# Patient Record
Sex: Female | Born: 1965 | Race: White | Hispanic: No | Marital: Married | Smoking: Never smoker
Health system: Southern US, Community
[De-identification: ages and names within clinical notes are randomized; demographics above are authoritative.]

## PROBLEM LIST (undated history)

## (undated) DIAGNOSIS — C569 Malignant neoplasm of unspecified ovary: Secondary | ICD-10-CM

## (undated) HISTORY — PX: HIP FRACTURE SURGERY: SHX118

---

## 1998-01-24 ENCOUNTER — Emergency Department (HOSPITAL_COMMUNITY): Admission: EM | Admit: 1998-01-24 | Discharge: 1998-01-24 | Payer: Self-pay | Admitting: Emergency Medicine

## 1999-07-22 ENCOUNTER — Encounter: Payer: Self-pay | Admitting: Emergency Medicine

## 1999-07-22 ENCOUNTER — Emergency Department (HOSPITAL_COMMUNITY): Admission: EM | Admit: 1999-07-22 | Discharge: 1999-07-22 | Payer: Self-pay | Admitting: Emergency Medicine

## 1999-09-16 ENCOUNTER — Inpatient Hospital Stay (HOSPITAL_COMMUNITY): Admission: EM | Admit: 1999-09-16 | Discharge: 1999-09-17 | Payer: Self-pay | Admitting: Emergency Medicine

## 1999-09-16 ENCOUNTER — Encounter: Payer: Self-pay | Admitting: *Deleted

## 1999-11-23 ENCOUNTER — Encounter: Payer: Self-pay | Admitting: Emergency Medicine

## 1999-11-23 ENCOUNTER — Inpatient Hospital Stay (HOSPITAL_COMMUNITY): Admission: EM | Admit: 1999-11-23 | Discharge: 1999-11-25 | Payer: Self-pay | Admitting: Emergency Medicine

## 1999-11-29 ENCOUNTER — Encounter: Admission: RE | Admit: 1999-11-29 | Discharge: 1999-11-29 | Payer: Self-pay | Admitting: Urology

## 1999-11-29 ENCOUNTER — Encounter: Payer: Self-pay | Admitting: Urology

## 2000-01-23 ENCOUNTER — Emergency Department (HOSPITAL_COMMUNITY): Admission: EM | Admit: 2000-01-23 | Discharge: 2000-01-23 | Payer: Self-pay | Admitting: Family Medicine

## 2000-01-23 ENCOUNTER — Encounter: Payer: Self-pay | Admitting: *Deleted

## 2000-04-15 ENCOUNTER — Emergency Department (HOSPITAL_COMMUNITY): Admission: EM | Admit: 2000-04-15 | Discharge: 2000-04-15 | Payer: Self-pay | Admitting: Emergency Medicine

## 2000-04-15 ENCOUNTER — Encounter: Payer: Self-pay | Admitting: Emergency Medicine

## 2002-11-23 ENCOUNTER — Encounter: Payer: Self-pay | Admitting: Emergency Medicine

## 2002-11-23 ENCOUNTER — Emergency Department (HOSPITAL_COMMUNITY): Admission: EM | Admit: 2002-11-23 | Discharge: 2002-11-23 | Payer: Self-pay | Admitting: Emergency Medicine

## 2003-02-02 ENCOUNTER — Ambulatory Visit (HOSPITAL_COMMUNITY): Admission: RE | Admit: 2003-02-02 | Discharge: 2003-02-02 | Payer: Self-pay | Admitting: Obstetrics & Gynecology

## 2003-02-02 ENCOUNTER — Encounter (INDEPENDENT_AMBULATORY_CARE_PROVIDER_SITE_OTHER): Payer: Self-pay | Admitting: Specialist

## 2003-09-04 ENCOUNTER — Inpatient Hospital Stay (HOSPITAL_COMMUNITY): Admission: AD | Admit: 2003-09-04 | Discharge: 2003-09-05 | Payer: Self-pay | Admitting: Obstetrics & Gynecology

## 2003-12-07 ENCOUNTER — Inpatient Hospital Stay (HOSPITAL_COMMUNITY): Admission: AD | Admit: 2003-12-07 | Discharge: 2003-12-07 | Payer: Self-pay | Admitting: Obstetrics

## 2004-02-11 ENCOUNTER — Inpatient Hospital Stay (HOSPITAL_COMMUNITY): Admission: AD | Admit: 2004-02-11 | Discharge: 2004-02-11 | Payer: Self-pay | Admitting: Obstetrics

## 2005-09-06 ENCOUNTER — Encounter: Payer: Self-pay | Admitting: Emergency Medicine

## 2009-12-29 ENCOUNTER — Emergency Department (HOSPITAL_COMMUNITY): Admission: EM | Admit: 2009-12-29 | Discharge: 2009-12-29 | Payer: Self-pay | Admitting: Emergency Medicine

## 2010-04-10 ENCOUNTER — Emergency Department (HOSPITAL_COMMUNITY)
Admission: EM | Admit: 2010-04-10 | Discharge: 2010-04-10 | Payer: Self-pay | Source: Home / Self Care | Admitting: Emergency Medicine

## 2010-05-29 ENCOUNTER — Encounter: Payer: Self-pay | Admitting: Obstetrics & Gynecology

## 2010-06-14 ENCOUNTER — Emergency Department (HOSPITAL_COMMUNITY)
Admission: EM | Admit: 2010-06-14 | Discharge: 2010-06-14 | Disposition: A | Discharge status: Home / Self Care | Payer: Self-pay | Attending: Emergency Medicine | Admitting: Emergency Medicine

## 2010-06-14 DIAGNOSIS — R112 Nausea with vomiting, unspecified: Secondary | ICD-10-CM | POA: Insufficient documentation

## 2010-06-14 DIAGNOSIS — R197 Diarrhea, unspecified: Secondary | ICD-10-CM | POA: Insufficient documentation

## 2010-06-14 LAB — URINALYSIS, ROUTINE W REFLEX MICROSCOPIC
Bilirubin Urine: NEGATIVE
Hgb urine dipstick: NEGATIVE
Ketones, ur: NEGATIVE mg/dL
Nitrite: NEGATIVE
Protein, ur: NEGATIVE mg/dL
Specific Gravity, Urine: 1.008 (ref 1.005–1.030)
Urine Glucose, Fasting: NEGATIVE mg/dL
Urobilinogen, UA: 0.2 mg/dL (ref 0.0–1.0)
pH: 6 (ref 5.0–8.0)

## 2010-06-14 LAB — POCT PREGNANCY, URINE: Preg Test, Ur: NEGATIVE

## 2010-06-14 LAB — POCT I-STAT, CHEM 8
BUN: 13 mg/dL (ref 6–23)
Calcium, Ion: 1.16 mmol/L (ref 1.12–1.32)
Chloride: 108 mEq/L (ref 96–112)
Creatinine, Ser: 0.8 mg/dL (ref 0.4–1.2)
Glucose, Bld: 101 mg/dL — ABNORMAL HIGH (ref 70–99)
HCT: 34 % — ABNORMAL LOW (ref 36.0–46.0)
Hemoglobin: 11.6 g/dL — ABNORMAL LOW (ref 12.0–15.0)
Potassium: 4.2 mEq/L (ref 3.5–5.1)
Sodium: 141 mEq/L (ref 135–145)
TCO2: 21 mmol/L (ref 0–100)

## 2010-09-23 NOTE — Procedures (Signed)
[place]  Patient:    Brenda Gallagher, Brenda Gallagher                       MRN: [id number] Adm. Date:  [id number] Attending:  Colon Branch CC:         Juluis Mire, M.D.                           Procedure Report  PROCEDURE:  Coronary arteriography.  INDICATIONS:  Recurrent chest pain.  The left main coronary artery was normal.  The left anterior descending artery was normal.  The first and second diagonal branches were normal.  The circumflex coronary artery was nondominant and was normal.  The right coronary artery was somewhat difficult to engage selectively.  JL4 and JL3.5 catheters were used.  There was no significant disease in this vessel and it was dominant.  RAO VENTRICULOGRAPHY:  The RAO ventriculography revealed hyperdynamic LV function with an ejection fraction in excess of 80%.  There was no significant mitral valve prolapse or MR.  The aortic pressure was 122/70.  The LV pressure was 114/25.  The right femoral artery was closed at the end of the case with the Perclose device with good hemostasis.  IMPRESSION:  The patient is having noncardiac chest pain.  She has had multiple evaluations now for atypical chest pain.  She has had a normal Cardiolite and normal coronary arteriography.  She has also had previous pulmonary emboli ruled out.  She will be discharged later today to follow up with Juluis Mire, M.D. DD:  11/24/99 TD:  11/24/99 Job: [id number]

## 2010-09-23 NOTE — Consult Note (Signed)
NAME:  Brenda Gallagher, Brenda Gallagher                          ACCOUNT NO.:  [id number]   MEDICAL RECORD NO.:  [id number]                   PATIENT TYPE:  MAT   LOCATION:  MATC                                 FACILITY:  [facility]   PHYSICIAN:  Michael L. Thad Ranger, M.D.           DATE OF BIRTH:  [birth year]   DATE OF CONSULTATION:  09/04/2003  DATE OF DISCHARGE:                                   CONSULTATION   REASON FOR EVALUATION:  Headache and dizziness.   HISTORY OF PRESENT ILLNESS:  This is the initial emergency room consultation  evaluation of this 45 year old woman with little past medical history,  presently approximately [redacted] weeks pregnant, who presents to the maternity  admissions tonight with a new headache.  The patient reports that she woke  up this morning with a dull headache in the frontal area, and on the right  side of the head.  It was relatively mild, but built up in severity to the  point that by the mid-afternoon it was severe and took on a throbbing  character.  It was associated with a dizzy sensation, more of a  lightheadedness than a true vertigo, as well as a sense of nausea, though  there was no vomiting.  She also felt a little  bit out of it, as if she  were there, but not there.  This headache lasted a couple of hours, and  was exacerbated by any movement, although this was not associated with large  amounts of photophobia or phonophobia.  She denies ever experiencing a  headache like this before.  Ultimately, she became concerned, and presented  to the maternity admissions unit, and a subsequent neurologic consultation  was requested.  The patient did take a few Tylenol this afternoon, and does  feel somewhat better.  She says that her headache is mild, as long as she  remains still, but if she gets up and around, the headache becomes  noticeably more severe.  She otherwise feels washed out and tired, but has  no other specific complaints at this time.   PAST MEDICAL HISTORY:   She denies any other chronic medical problems.  She  has had 3 uneventful pregnancies.  She denies any history of car sickness as  a child.   FAMILY HISTORY:  No other known headache syndromes running in the family.   MEDICATIONS:  Prenatal vitamins.   PHYSICAL EXAMINATION:  VITAL SIGNS:  Temperature 97.8, blood pressure  137/74, pulse 83, respirations 20.  GENERAL:  This is a healthy-appearing, not obviously gravid woman, supine in  a hospital bed, in no evident distress.  HEENT:  Cranium was normocephalic and atraumatic.  Oropharynx was benign.  NECK:  Supple without carotid bruits.  HEART:  Regular rate and rhythm without murmur.  NEUROLOGIC:  Mental status - she is awake, alert, and fully oriented.  Recent and remote memory are intact.  Attention span and concentration and  fund of knowledge are appropriate.  Speech is fluent and not dysarthric.  There are no defects to confrontational naming.  Mood is euthymic, and  affect appropriate.  Cranial nerves - funduscopic exam is benign;  specifically, disks are flat without evidence of palpable edema.  Pupils are  equal and briskly reactive.  Extraocular movements are full without  nystagmus.  Visual fields full to confrontation.  Hearing is intact to  conversational speech.  Facial sensation is intact to pinprick.  Face,  tongue, and palate move normally and symmetrically.  Shoulder shrug strength  is normal.  Motor testing with normal bulk and tone.  Normal strength in all  tested extremity muscles.  Sensation intact to light tough and pinprick in  all extremities. Coordination rapid movements are performed well.  Finger-to-  nose and heel-to-shin are performed adequately.  Gait - she arises from the  bed with a little difficulty.  Stance is normal.  She is able to tandem walk  without difficulty.  Reflexes 2+ and symmetric.  Toes are downgoing.   LABORATORY REVIEW:  CBC revealed a white count mildly elevated at 12.4,  hemoglobin  12.8, platelets 394,000.  CMET is unremarkable, except for a  mildly elevated glucose of 113.  Quantitative beta hCG is appropriate for  pregnancy stage.  Urinalysis is remarkable for mild ketones.  CT of the head  is pending at this time.   IMPRESSION:  New onset headache in an early gravid 45 year old woman with a  normal neurological evaluation.  This appears to be a benign headache  syndrome with vascular qualities associated with some nonspecific dizziness,  probably representing a migraine event.  There are no malignant features by  either history of examination.   RECOMMENDATIONS:  I would check CT, but anticipate that it will be normal.  If so, would treat symptomatically with pain medications and medication for  sleep promotion.  I think she will feel better tomorrow after a good night's  sleep.  She can then follow up as needed.  If the headache recurs,  consideration may need to be given to prophylaxis.                                               Michael L. Thad Ranger, M.D.    MLR/MEDQ  D:  09/04/2003  T:  09/04/2003  Job:  [id number]

## 2010-09-23 NOTE — Discharge Summary (Signed)
[place]. [facility]  Patient:    Brenda Gallagher, Brenda Gallagher                       MRN: [id number] Adm. Date:  [id number] Disc. Date: [id number] Attending:  Colon Branch Dictator:   Tereso Newcomer, P.A.-C CC:         Madolyn Frieze. Jens Som, M.D. [facility]             Peter C. Eden Emms, M.D. [facility]             Juluis Mire, M.D.                           Discharge Summary  DISCHARGE DIAGNOSES: 1. Mitral valve prolapse. 2. Syncope. 3. Dyspnea on exertion with non-exertional chest pain.  HISTORY OF PRESENT ILLNESS:  Ms. Brenda Gallagher is a 45 year old white female with a history of coronary artery disease who presented to the emergency room with complaints of chest pressure.  The pain began while walking about her house around 8 a.m. on the morning of admission.  She became clammy, short of breath and diaphoretic passing out.  This was not witnessed.  She claimed to be out for about two minutes before recovering.  The chest discomfort persisted in the emergency department with partial relief after sublingual nitroglycerin. Chest pain was reportedly worse after lying down and better after standing up. She also associated this with some nausea and vomiting x 1 on the date of admission.  There was no referred radiating or sharp pain.  The pain was somewhat aggravated by lifting and deep breathing.  She reported some hemoptysis and sharp pain in her chest two weeks prior.  She has experienced recurrent chest pressure with hours per episode since March, sometimes lasting two to three days.  This is not usually precipitated by exertion.  She has had dyspnea on exertion since onset of first chest pain.  She reports and occasional rapid heart beat.  PHYSICAL EXAMINATION:  GENERAL:  This is an alert female in no acute distress.  VITAL SIGNS:  Blood pressure 110/68, standing blood pressure 125/72, supine pulse 81, standing pulse 81.  HEART:  Apical impulse not displaced, regular rate and  rhythm.  LUNGS:  Clear.  EXTREMITIES:  Trace edema.  LABORATORY DATA AND X-RAY FINDINGS:  Hemoglobin 12.5, hematocrit 36.7, wbcs 5.6, platelet count 301,000.  Sodium 140, potassium 3.5, chloride 109, CO2 27, BUN 16, creatinine 0.7, glucose 106.  HOSPITAL COURSE:  The patient was admitted.  Given her symptoms not being consistent with ischemic pain and a recent negative Cardiolite, it was felt that cardiac ischemia was unlikely.  The patient was ruled out for MI by enzymes and EKG.  Her total CK was 54, 41 and 37 with MB 0.8, 0.3, 0.7, troponin I less than 0.03 x 3.  The patient had a normal TSH of 0.493.  She had a 2D echocardiogram performed on the date of admission.  This showed a normal left ventricle in size, wall thickness and ejection fraction.  There was mild mitral valve prolapse noted.  VQ scan was performed.  The patient had a negative chest CT for a pulmonary embolism in 3/01.  It was questioned if this was a false negative.  The VQ scan was normal.  This was a preliminary reading at the time of discharge.  The patient was monitored overnight on telemetry without any significant arrhythmias noted.  It was felt that she was stable enough for discharge to home on the morning of 09/17/99.  DISCHARGE MEDICATIONS: 1. Toprol XL 50 mg 1/2 p.o. q.d. 2. Tylenol p.r.n. 3. Vitamins as usual.  ACTIVITY:  As tolerated.  DIET:  Low fat, low cholesterol diet.  FOLLOWUP:  The patient is to call our office on Monday to set up a time to pick of a 24-hour ventilation monitor.  She will follow up with Dr. Eden Emms in the office after the ventilation monitor is complete.  She has been provided with the phone number.  This will help workup her report syncope at the time of admission. DD:  09/17/99 TD:  09/19/99 Job: [id number]

## 2010-09-23 NOTE — Discharge Summary (Signed)
[place]. [facility]  Patient:    Brenda Gallagher, Brenda Gallagher                       MRN: [id number] Adm. Date:  [id number] Disc. Date: 11/25/99 Attending:  Colon Branch Dictator:   Joellyn Rued, P.A.C. CC:         Dr. Tiburcio Pea                           Discharge Summary  DATE OF BIRTH: [birth year]  HISTORY OF PRESENT ILLNESS: Brenda Gallagher is a 45 year old female without a cardiac history; in fact, she had a negative Cardiolite back in March 2001. She describes onset of left-sided chest discomfort the morning of admission while doing her activities of daily living and it then eased off and she went to work.  She then developed a knife-like discomfort associated with shortness of breath.  Tums or Mylanta did not help the discomfort.  She also felt nauseated and diaphoretic.  At one point she felt like she was going to pass out.  Dr. Tiburcio Pea referred her for further evaluation.  LABORATORY DATA: CKs and troponins were negative for myocardial infarction. Hemoglobin was 13.4, hematocrit 38.1; normal indices; platelets 429,000; WBC 8.9.  PT 14.5, PTT 27.  Sodium was 137, potassium 2.8, BUN 12, creatinine 0.8, glucose 102.  Repeat potassium on November 24, 1999 was 3.6.  Fasting lipid panel showed a total cholesterol of 162, triglyceride 113, HDL 33, LDL 106.  TSH was borderline low at 0.252.  This was repeated and was 0.240.  Chest x-ray showed poor basilar aeration, no active disease.  EKG showed normal sinus rhythm, no acute changes.  HOSPITAL COURSE: Given Brenda Gallagher continued recurrent symptoms and negative prior cardiac work-up cardiac catheterization was performed on November 24, 1999 by Dr. Eden Emms and this revealed normal coronaries with normal LV function, with EF of approximately 80%.  There was no significant MR or MVP.  Her pressures were within normal limits.  Post-sheath removal and bed rest it was felt she could be discharged home; however, Brenda Gallagher  continued to have chest discomfort and stated that she wanted to spend the night until she felt better.  The following morning she was ambulating.  She continued to have the chest discomfort but Dr. Eden Emms felt it was not cardiac in etiology and she was thus discharged home.  FINAL DIAGNOSES:  1. Noncardiac chest discomfort.  2. Low TSH.  DISPOSITION: She is discharged home.  FOLLOW-UP: She is asked to follow up with Dr. Tiburcio Pea and to consider having a repeat TSH along with a T4 performed.  DISCHARGE MEDICATIONS:  1. Toprol XL 25 mg q.d.  2. Tylenol.  3. Multivitamin.  DISCHARGE ACTIVITY: She was advised to do no lifting, driving, sexual activity, or heavy exertion for two days.  DISCHARGE INSTRUCTIONS: If she has any problems with her catheterization site she was asked to call us immediately. DD:  11/25/99 TD:  11/25/99 Job: [id number]

## 2010-09-23 NOTE — Op Note (Signed)
   NAME:  SHAREECE, BULTMAN                          ACCOUNT NO.:  [id number]   MEDICAL RECORD NO.:  [id number]                   PATIENT TYPE:  AMB   LOCATION:  SDC                                  FACILITY:  [facility]   PHYSICIAN:  Roseanna Rainbow, M.D.         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   PROCEDURE:  Removal of Norplant and lysis of adhesions.   SURGEON:  Roseanna Rainbow, M.D.   ANESTHESIA:  Laryngeal mask airway.   COMPLICATIONS:  None.   ESTIMATED BLOOD LOSS:  Less than 50 cc.   PROCEDURE:  The patient was taken to the operating room.  A laryngeal mask  airway was placed without difficulty.  The arm was prepped and draped in  usual sterile fashion.  The skin was incised at the initial insertion point.  A plane was dissected between the dermis and subcutaneous layer.  Each  implant was then grasped with a mosquito clamp and the scar tissue sharply  removed.  Five of the capsules were removed intact, one  was fractured;  however, it was removed totally into segments.  The subcutaneous layer was  then irrigated.  The skin was then reapproximated with intracuticular  interrupted sutures of 3-0 Vicryl.  Adequate hemostasis was noted.  The  patient tolerated the procedure well.  At the close of the procedure the  sponge, needle and instrument count were reported to be correct x2.  The  patient was taken to the PACU in stable condition.                                               Roseanna Rainbow, M.D.    Judee Clara  D:  02/04/2003  T:  02/04/2003  Job:  [id number]

## 2010-11-05 ENCOUNTER — Emergency Department (HOSPITAL_COMMUNITY)
Admission: EM | Admit: 2010-11-05 | Discharge: 2010-11-05 | Disposition: A | Discharge status: Home / Self Care | Payer: Self-pay | Attending: Emergency Medicine | Admitting: Emergency Medicine

## 2010-11-05 ENCOUNTER — Emergency Department (HOSPITAL_COMMUNITY): Payer: Self-pay

## 2010-11-05 DIAGNOSIS — R6889 Other general symptoms and signs: Secondary | ICD-10-CM | POA: Insufficient documentation

## 2010-11-05 DIAGNOSIS — J40 Bronchitis, not specified as acute or chronic: Secondary | ICD-10-CM | POA: Insufficient documentation

## 2010-11-05 DIAGNOSIS — R05 Cough: Secondary | ICD-10-CM | POA: Insufficient documentation

## 2010-11-05 DIAGNOSIS — R059 Cough, unspecified: Secondary | ICD-10-CM | POA: Insufficient documentation

## 2011-03-16 ENCOUNTER — Encounter: Payer: Self-pay | Admitting: *Deleted

## 2011-03-16 ENCOUNTER — Emergency Department (HOSPITAL_COMMUNITY)
Admission: EM | Admit: 2011-03-16 | Discharge: 2011-03-16 | Disposition: A | Discharge status: Home / Self Care | Payer: Self-pay | Attending: Emergency Medicine | Admitting: Emergency Medicine

## 2011-03-16 DIAGNOSIS — R0981 Nasal congestion: Secondary | ICD-10-CM

## 2011-03-16 DIAGNOSIS — J3489 Other specified disorders of nose and nasal sinuses: Secondary | ICD-10-CM | POA: Insufficient documentation

## 2011-03-16 DIAGNOSIS — H9209 Otalgia, unspecified ear: Secondary | ICD-10-CM | POA: Insufficient documentation

## 2011-03-16 MED ORDER — DIPHENHYDRAMINE HCL 25 MG PO CAPS
25.0000 mg | ORAL_CAPSULE | Freq: Once | ORAL | Status: AC
  Administered 2011-03-16: 25 mg via ORAL
  Filled 2011-03-16: qty 1

## 2011-03-16 MED ORDER — PSEUDOEPHEDRINE-IBUPROFEN 30-200 MG PO TABS: ORAL_TABLET | ORAL | Status: DC

## 2011-03-16 NOTE — ED Provider Notes (Signed)
History     CSN: [id number] Arrival date & time: 03/16/2011  1:08 PM   First MD Initiated Contact with Patient 03/16/11 1506      Chief Complaint  Patient presents with  . Otalgia    (Consider location/radiation/quality/duration/timing/severity/associated sxs/prior treatment) Patient is a 45 y.o. female presenting with ear pain.  Otalgia  Patient presents to ER complaining of gradual onset sinus pressure and left ear pain that began 2 days ago with gradual onset right ear ache today with associated "ear ringing" and mild HA but denies known fevers, chills, dizziness, visual changes, stiff neck, rash, cough, CP or SOB. Patient has taken tylenol with mild improvement of symptoms. Denies alleviating factors.   History reviewed. No pertinent past medical history.  History reviewed. No pertinent past surgical history.  No family history on file.  History  Substance Use Topics  . Smoking status: Never Smoker   . Smokeless tobacco: Not on file  . Alcohol Use: No    OB History    Grav Para Term Preterm Abortions TAB SAB Ect Mult Living                  Review of Systems  HENT: Positive for ear pain, sinus pressure and tinnitus.   All other systems reviewed and are negative.    Allergies  Sulfa antibiotics  Home Medications   Current Outpatient Rx  Name Route Sig Dispense Refill  . PSEUDOEPHEDRINE-IBUPROFEN 30-200 MG PO TABS  Take as directed by over the counter instructions for sinus congestion, inflammation and pain 84 each 0    BP 131/84  Pulse 75  Temp(Src) 98.3 F (36.8 C) (Oral)  Resp 14  Wt 140 lb (63.504 kg)  SpO2 100%  Physical Exam  Vitals reviewed. Constitutional: She is oriented to person, place, and time. She appears well-developed and well-nourished. No distress.  HENT:  Head: Normocephalic and atraumatic.  Right Ear: External ear normal.  Left Ear: External ear normal.  Nose: Nose normal.  Mouth/Throat: No oropharyngeal exudate.       Mild  erythema of posterior pharynx and tonsils no tonsillar exudate or enlargement. Patent airway. Swallowing secretions well  Eyes: Conjunctivae are normal.  Neck: Normal range of motion. Neck supple.  Cardiovascular: Normal rate, regular rhythm and normal heart sounds.  Exam reveals no gallop and no friction rub.   No murmur heard. Pulmonary/Chest: Effort normal and breath sounds normal. No respiratory distress. She has no wheezes. She has no rales. She exhibits no tenderness.  Abdominal: Soft. She exhibits no distension and no mass. There is no tenderness. There is no rebound and no guarding.  Lymphadenopathy:    She has no cervical adenopathy.  Neurological: She is alert and oriented to person, place, and time. She has normal reflexes.       Gross hearing to whisper in both ears intact.   Skin: Skin is warm and dry. No rash noted. She is not diaphoretic.  Psychiatric: She has a normal mood and affect.    ED Course  Procedures (including critical care time)  Labs Reviewed - No data to display No results found.   1. Nasal congestion   2. Earache       MDM  Afebrile, non toxic appearing with normal TM's and hearing bilaterally.         Jenness Corner, [place] 03/16/11 [phone]

## 2011-03-16 NOTE — ED Notes (Signed)
Pt states "left ear has been bothering me since Monday, but they both hurt, they are ringing, today they are really bad"

## 2011-03-17 NOTE — ED Provider Notes (Signed)
Evaluation and management procedures were performed by the PA/NP under my supervision/collaboration.    Modestine Scherzinger D Lovetta Condie, MD 03/17/11 1432 

## 2011-08-23 ENCOUNTER — Emergency Department (HOSPITAL_COMMUNITY)
Admission: EM | Admit: 2011-08-23 | Discharge: 2011-08-23 | Disposition: A | Discharge status: Home / Self Care | Payer: Self-pay | Attending: Emergency Medicine | Admitting: Emergency Medicine

## 2011-08-23 ENCOUNTER — Emergency Department (HOSPITAL_COMMUNITY): Payer: Self-pay

## 2011-08-23 ENCOUNTER — Encounter (HOSPITAL_COMMUNITY): Payer: Self-pay

## 2011-08-23 DIAGNOSIS — R51 Headache: Secondary | ICD-10-CM | POA: Insufficient documentation

## 2011-08-23 DIAGNOSIS — R55 Syncope and collapse: Secondary | ICD-10-CM | POA: Insufficient documentation

## 2011-08-23 HISTORY — DX: Malignant neoplasm of unspecified ovary: C56.9

## 2011-08-23 LAB — CBC
HCT: 36.4 % (ref 36.0–46.0)
MCHC: 30.5 g/dL (ref 30.0–36.0)
MCV: 73.7 fL — ABNORMAL LOW (ref 78.0–100.0)
RBC: 4.94 MIL/uL (ref 3.87–5.11)

## 2011-08-23 LAB — BASIC METABOLIC PANEL
CO2: 26 mEq/L (ref 19–32)
Chloride: 106 mEq/L (ref 96–112)
GFR calc non Af Amer: >90 mL/min (ref >90)

## 2011-08-23 LAB — TROPONIN I: Troponin I: <0.3 ng/mL (ref <0.30)

## 2011-08-23 MED ORDER — SUMATRIPTAN SUCCINATE 25 MG PO TABS: 25.0000 mg | ORAL_TABLET | ORAL | Status: DC | PRN

## 2011-08-23 MED ORDER — ONDANSETRON 4 MG PO TBDP: 4.0000 mg | ORAL_TABLET | Freq: Three times a day (TID) | ORAL | Status: AC | PRN

## 2011-08-23 MED ORDER — DEXAMETHASONE SODIUM PHOSPHATE 10 MG/ML IJ SOLN: 10.0000 mg | Freq: Once | INTRAMUSCULAR | Status: DC

## 2011-08-23 MED ORDER — DEXAMETHASONE SODIUM PHOSPHATE 10 MG/ML IJ SOLN
10.0000 mg | Freq: Once | INTRAMUSCULAR | Status: AC
  Administered 2011-08-23: 10 mg via INTRAMUSCULAR
  Filled 2011-08-23: qty 1

## 2011-08-23 MED ORDER — OXYCODONE-ACETAMINOPHEN 5-325 MG PO TABS
2.0000 | ORAL_TABLET | Freq: Once | ORAL | Status: AC
  Administered 2011-08-23: 2 via ORAL
  Filled 2011-08-23: qty 2

## 2011-08-23 NOTE — ED Notes (Signed)
Patient reports that she passed out at work. Patient denies hitting her head. Patient denies feeling weak or dizzy.

## 2011-08-23 NOTE — ED Notes (Signed)
Pt. Discharged to home, pt. Alert and oriented, NAD noted, pt. Ambulatory gait steady  

## 2011-08-23 NOTE — ED Provider Notes (Signed)
History     CSN: [id number]  Arrival date & time 08/23/11  1420   First MD Initiated Contact with Patient 08/23/11 1757      Chief Complaint  Patient presents with  . Loss of Consciousness    (Consider location/radiation/quality/duration/timing/severity/associated sxs/prior treatment) HPI  Pt presents to the ED with complaints of near syncopal episode at work. She states that  She ate at 7:30am and then felt as thought she was going to pass out around 11;30am. The patient states that she has had a headache for 3 days and today she has had decreased hearing in the left ear. She denies having headache, generalized or focal weakness, urinary symptoms, chest pains, change in vision. Pt denies history of the same. Pt is in no acute distress and is resting comfortably. She has no complaints at this time but her work sent her to the ED from work to be evaluated.   Past Medical History  Diagnosis Date  . Ovarian cancer     Past Surgical History  Procedure Date  . Hip fracture surgery     Family History  Problem Relation Age of Onset  . Diabetes Mother   . Heart failure Mother   . Heart failure Father     History  Substance Use Topics  . Smoking status: Never Smoker   . Smokeless tobacco: Never Used  . Alcohol Use: No    OB History    Grav Para Term Preterm Abortions TAB SAB Ect Mult Living                  Review of Systems  All other systems reviewed and are negative.    Allergies  Sulfa antibiotics  Home Medications   Current Outpatient Rx  Name Route Sig Dispense Refill  . FERROUS SULFATE 325 (65 FE) MG PO TABS Oral Take 325 mg by mouth daily with breakfast.    . IBUPROFEN 200 MG PO TABS Oral Take 400 mg by mouth every 8 (eight) hours as needed. For pain.    Marland Kitchen ONDANSETRON 4 MG PO TBDP Oral Take 1 tablet (4 mg total) by mouth every 8 (eight) hours as needed for nausea. 20 tablet 0  . SUMATRIPTAN SUCCINATE 25 MG PO TABS Oral Take 1 tablet (25 mg total) by  mouth every 2 (two) hours as needed for migraine. 10 tablet 0    BP 132/77  Pulse 81  Temp(Src) 98.8 F (37.1 C) (Oral)  Resp 18  SpO2 100%  LMP 08/20/2011  Physical Exam  Nursing note and vitals reviewed. Constitutional: She is oriented to person, place, and time. She appears well-developed and well-nourished. No distress.  HENT:  Head: Normocephalic and atraumatic.  Eyes: Pupils are equal, round, and reactive to light.  Neck: Normal range of motion. Neck supple.  Cardiovascular: Normal rate and regular rhythm.   Pulmonary/Chest: Effort normal. No respiratory distress. She has no wheezes.  Abdominal: Soft.  Neurological: She is oriented to person, place, and time. She has normal strength. No cranial nerve deficit or sensory deficit. She displays a negative Romberg sign. GCS eye subscore is 4. GCS verbal subscore is 5. GCS motor subscore is 6.  Skin: Skin is warm and dry.    ED Course  Procedures (including critical care time)  Labs Reviewed  CBC - Abnormal; Notable for the following:    Hemoglobin 11.1 (*)    MCV 73.7 (*)    MCH 22.5 (*)    RDW 17.5 (*)  All other components within normal limits  BASIC METABOLIC PANEL  TROPONIN I   Ct Head Wo Contrast  08/23/2011  *RADIOLOGY REPORT*  Clinical Data: Syncope.  CT HEAD WITHOUT CONTRAST  Technique:  Contiguous axial images were obtained from the base of the skull through the vertex without contrast.  Comparison: Head CT scan 09/04/2003.  Findings: The brain appears normal without evidence of acute infarction, hemorrhage, mass lesion, mass effect, midline shift or abnormal extra-axial fluid collection.  No hydrocephalus or pneumocephalus.  Calvarium intact.  IMPRESSION: Negative head CT.  Original Report Authenticated By: Bernadene Bell. D'ALESSIO, M.D.     1. Headache   2. Near syncope       MDM     Date: 08/23/2011  Rate: 69  Rhythm: normal sinus rhythm  QRS Axis: normal  Intervals: normal  ST/T Wave abnormalities:  normal  Conduction Disutrbances:none  Narrative Interpretation: borderline R wave progression, anterior leads  Old EKG Reviewed: unchanged from November 23, 2002   I have discussed case with Dr. Radford Pax who agrees with my work-up. The patients head CT and Troponin are negative. Pt is feeling much better and is requesting to leave ED. I will give pt follow-up to Neurology for headaches.    Pt has been advised of the symptoms that warrant their return to the ED. Patient has voiced understanding and has agreed to follow-up with the PCP or specialist.     Dorthula Matas, PA 08/23/11 2036

## 2011-08-23 NOTE — Discharge Instructions (Signed)
Near-Syncope Near-syncope is sudden weakness, dizziness, or feeling like you might pass out (faint). This may occur when getting up after sitting or while standing for a long period of time. Near-syncope can be caused by a drop in blood pressure. This is a common reaction, but it may occur to a greater degree in people taking medicines to control their blood pressure. Fainting often occurs when the blood pressure or pulse is too low to provide enough blood flow to the brain to keep you conscious. Fainting and near-syncope are not usually due to serious medical problems. However, certain people should be more cautious in the event of near-syncope, including elderly patients, patients with diabetes, and patients with a history of heart conditions (especially irregular rhythms).  CAUSES   Drop in blood pressure.   Physical pain.   Dehydration.   Heat exhaustion.   Emotional distress.   Low blood sugar.   Internal bleeding.   Heart and circulatory problems.   Infections.  SYMPTOMS   Dizziness.   Feeling sick to your stomach (nauseous).   Nearly fainting.   Body numbness.   Turning pale.   Tunnel vision.   Weakness.  HOME CARE INSTRUCTIONS   Lie down right away if you start feeling like you might faint. Breathe deeply and steadily. Wait until all the symptoms have passed. Most of these episodes last only a few minutes. You may feel tired for several hours.   Drink enough fluids to keep your urine clear or pale yellow.   If you are taking blood pressure or heart medicine, get up slowly, taking several minutes to sit and then stand. This can reduce dizziness that is caused by a drop in blood pressure.  SEEK IMMEDIATE MEDICAL CARE IF:   You have a severe headache.   Unusual pain develops in the chest, abdomen, or back.   There is bleeding from the mouth or rectum, or you have black or tarry stool.   An irregular heartbeat or a very rapid pulse develops.   You have  repeated fainting or seizure-like jerking during an episode.   You faint when sitting or lying down.   You develop confusion.   You have difficulty walking.   Severe weakness develops.   Vision problems develop.  MAKE SURE YOU:   Understand these instructions.   Will watch your condition.   Will get help right away if you are not doing well or get worse.  Document Released: 04/24/2005 Document Revised: 04/13/2011 Document Reviewed: 06/10/2010 [facility] Patient Information 2012 [place], [place].Headache, General, Unknown Cause The specific cause of your headache may not have been found today. There are many causes and types of headache. A few common ones are:  Tension headache.   Migraine.   Infections (examples: dental and sinus infections).   Bone and/or joint problems in the neck or jaw.   Depression.   Eye problems.  These headaches are not life threatening.  Headaches can sometimes be diagnosed by a patient history and a physical exam. Sometimes, lab and imaging studies (such as x-ray and/or CT scan) are used to rule out more serious problems. In some cases, a spinal tap (lumbar puncture) may be requested. There are many times when your exam and tests may be normal on the first visit even when there is a serious problem causing your headaches. Because of that, it is very important to follow up with your doctor or local clinic for further evaluation. FINDING OUT THE RESULTS OF TESTS  If a  radiology test was performed, a radiologist will review your results.   You will be contacted by the emergency department or your physician if any test results require a change in your treatment plan.   Not all test results may be available during your visit. If your test results are not back during the visit, make an appointment with your caregiver to find out the results. Do not assume everything is normal if you have not heard from your caregiver or the medical facility. It is important  for you to follow up on all of your test results.  HOME CARE INSTRUCTIONS   Keep follow-up appointments with your caregiver, or any specialist referral.   Only take over-the-counter or prescription medicines for pain, discomfort, or fever as directed by your caregiver.   Biofeedback, massage, or other relaxation techniques may be helpful.   Ice packs or heat applied to the head and neck can be used. Do this three to four times per day, or as needed.   Call your doctor if you have any questions or concerns.   If you smoke, you should quit.  SEEK MEDICAL CARE IF:   You develop problems with medications prescribed.   You do not respond to or obtain relief from medications.   You have a change from the usual headache.   You develop nausea or vomiting.  SEEK IMMEDIATE MEDICAL CARE IF:   If your headache becomes severe.   You have an unexplained oral temperature above 102 F (38.9 C), or as your caregiver suggests.   You have a stiff neck.   You have loss of vision.   You have muscular weakness.   You have loss of muscular control.   You develop severe symptoms different from your first symptoms.   You start losing your balance or have trouble walking.   You feel faint or pass out.  MAKE SURE YOU:   Understand these instructions.   Will watch your condition.   Will get help right away if you are not doing well or get worse.  Document Released: 04/24/2005 Document Revised: 04/13/2011 Document Reviewed: 12/12/2007 [facility] Patient Information 2012 [place], [place].

## 2011-08-23 NOTE — ED Notes (Signed)
Bed:WA09<BR> Expected date:<BR> Expected time:<BR> Means of arrival:<BR> Comments:<BR> triage

## 2011-08-25 NOTE — ED Provider Notes (Signed)
Medical screening examination/treatment/procedure(s) were performed by non-physician practitioner and as supervising physician I was immediately available for consultation/collaboration.    Mclean Moya L Treyten Monestime, MD 08/25/11 2034 

## 2011-10-23 ENCOUNTER — Emergency Department (HOSPITAL_COMMUNITY)
Admission: EM | Admit: 2011-10-23 | Discharge: 2011-10-23 | Disposition: A | Discharge status: Home / Self Care | Payer: Self-pay | Attending: Emergency Medicine | Admitting: Emergency Medicine

## 2011-10-23 ENCOUNTER — Encounter (HOSPITAL_COMMUNITY): Payer: Self-pay | Admitting: Emergency Medicine

## 2011-10-23 DIAGNOSIS — L255 Unspecified contact dermatitis due to plants, except food: Secondary | ICD-10-CM | POA: Insufficient documentation

## 2011-10-23 DIAGNOSIS — Z8543 Personal history of malignant neoplasm of ovary: Secondary | ICD-10-CM | POA: Insufficient documentation

## 2011-10-23 DIAGNOSIS — T622X1A Toxic effect of other ingested (parts of) plant(s), accidental (unintentional), initial encounter: Secondary | ICD-10-CM | POA: Insufficient documentation

## 2011-10-23 DIAGNOSIS — L237 Allergic contact dermatitis due to plants, except food: Secondary | ICD-10-CM

## 2011-10-23 DIAGNOSIS — Z882 Allergy status to sulfonamides status: Secondary | ICD-10-CM | POA: Insufficient documentation

## 2011-10-23 MED ORDER — PREDNISONE 50 MG PO TABS: 50.0000 mg | ORAL_TABLET | Freq: Every day | ORAL | Status: DC

## 2011-10-23 MED ORDER — DEXAMETHASONE SODIUM PHOSPHATE 10 MG/ML IJ SOLN
10.0000 mg | Freq: Once | INTRAMUSCULAR | Status: AC
  Administered 2011-10-23: 10 mg via INTRAMUSCULAR
  Filled 2011-10-23: qty 1

## 2011-10-23 NOTE — ED Provider Notes (Signed)
Medical screening examination/treatment/procedure(s) were performed by non-physician practitioner and as supervising physician I was immediately available for consultation/collaboration.   Lyanne Co, MD 10/23/11 [phone]

## 2011-10-23 NOTE — ED Provider Notes (Signed)
History     CSN: [id number]  Arrival date & time 10/23/11  1324   First MD Initiated Contact with Patient 10/23/11 [phone]      Chief Complaint  Patient presents with  . Rash    (Consider location/radiation/quality/duration/timing/severity/associated sxs/prior treatment) HPI Patient presents to the emergency department with a rash to her arms, left neck and lower legs. The patient states that she was working in the yard last week and then developed the rash. The patient states that she has not tried any treatments for her rash.  Past Medical History  Diagnosis Date  . Ovarian cancer     Past Surgical History  Procedure Date  . Hip fracture surgery     Family History  Problem Relation Age of Onset  . Diabetes Mother   . Heart failure Mother   . Cancer Mother   . Heart failure Father   . Diabetes Father     History  Substance Use Topics  . Smoking status: Never Smoker   . Smokeless tobacco: Never Used  . Alcohol Use: No    OB History    Grav Para Term Preterm Abortions TAB SAB Ect Mult Living                  Review of Systems All other systems negative except as documented in the HPI. All pertinent positives and negatives as reviewed in the HPI.  Allergies  Sulfa antibiotics  Home Medications   Current Outpatient Rx  Name Route Sig Dispense Refill  . FERROUS SULFATE 325 (65 FE) MG PO TABS Oral Take 325 mg by mouth daily with breakfast.    . IBUPROFEN 200 MG PO TABS Oral Take 400 mg by mouth every 8 (eight) hours as needed. For pain.    . SUMATRIPTAN SUCCINATE 25 MG PO TABS Oral Take 1 tablet (25 mg total) by mouth every 2 (two) hours as needed for migraine. 10 tablet 0    BP 140/87  Pulse 77  Temp 98.3 F (36.8 C) (Oral)  Resp 15  Ht 4\' 11"  (1.499 m)  Wt 138 lb (62.596 kg)  BMI 27.87 kg/m2  SpO2 100%  LMP 10/20/2011  Physical Exam  Nursing note and vitals reviewed. Constitutional: She appears well-developed and well-nourished.  Cardiovascular:  Normal rate and regular rhythm.  Exam reveals no gallop.   No murmur heard. Pulmonary/Chest: Effort normal and breath sounds normal.  Skin: Skin is warm and dry. Rash noted. Rash is macular and vesicular.       ED Course  Procedures (including critical care time) The patient will be treated for poison oak based on her HPI and PE findings. She is told to return here as needed.  MDM          Carlyle Dolly, PA-C 10/23/11 [phone]

## 2011-10-23 NOTE — ED Notes (Signed)
Pt states that she began getting some raise rash on her arms Saturday then this morning woke up with many more areas on her arms and legs bilaterally.  Pt denies any changes to her usual skin products or detergents but did state that she was gardening earlier in the week. Pt is in no apparent distress, no SOB, afebrile.

## 2011-10-23 NOTE — Discharge Instructions (Signed)
Return here as needed. Use benadryl as well.

## 2011-10-23 NOTE — ED Notes (Signed)
Pt c/o rash that began Saturday. Pt states she first noticed hives on lower legs bilaterally on Saturday. Pt states when she woke up this morning hives had spread to forearms bilaterally and left side of neck. Pt denies pain but states hives itch. Pt has tried OTC ointment without relief.

## 2011-10-30 ENCOUNTER — Encounter (HOSPITAL_COMMUNITY): Payer: Self-pay | Admitting: Emergency Medicine

## 2011-10-30 ENCOUNTER — Emergency Department (HOSPITAL_COMMUNITY)
Admission: EM | Admit: 2011-10-30 | Discharge: 2011-10-30 | Disposition: A | Discharge status: Home / Self Care | Payer: Self-pay | Attending: Emergency Medicine | Admitting: Emergency Medicine

## 2011-10-30 DIAGNOSIS — Z8543 Personal history of malignant neoplasm of ovary: Secondary | ICD-10-CM | POA: Insufficient documentation

## 2011-10-30 DIAGNOSIS — R21 Rash and other nonspecific skin eruption: Secondary | ICD-10-CM | POA: Insufficient documentation

## 2011-10-30 DIAGNOSIS — Z882 Allergy status to sulfonamides status: Secondary | ICD-10-CM | POA: Insufficient documentation

## 2011-10-30 MED ORDER — PREDNISONE 20 MG PO TABS
60.0000 mg | ORAL_TABLET | Freq: Once | ORAL | Status: AC
  Administered 2011-10-30: 60 mg via ORAL
  Filled 2011-10-30: qty 3

## 2011-10-30 MED ORDER — PREDNISONE 10 MG PO TABS: 40.0000 mg | ORAL_TABLET | Freq: Every day | ORAL | Status: DC

## 2011-10-30 NOTE — ED Provider Notes (Signed)
History     CSN: [id number]  Arrival date & time 10/30/11  3086   First MD Initiated Contact with Patient 10/30/11 [phone]      Chief Complaint  Patient presents with  . Rash    increased swelling and redness in rash    (Consider location/radiation/quality/duration/timing/severity/associated sxs/prior treatment) HPI Comments: Patient who was seen in ED on 6/17 and diagnosed with poison oak returns today with worsening pruritic rash of bilateral lower legs, right worse than left.  Has also had some mild swelling of her legs that she does not usually have, worse with standing for long periods of time.  States she has been using hydrocortisone cream and calamine lotion, occasionally takes benadryl.  States she did not have the prednisone filled.  Denies fevers.    Patient is a 46 y.o. female presenting with rash. The history is provided by the patient.  Rash  The current episode started more than 1 week ago. The problem has been gradually worsening. The problem is associated with plant contact. There has been no fever. Associated symptoms include blisters and itching.    Past Medical History  Diagnosis Date  . Ovarian cancer     Past Surgical History  Procedure Date  . Hip fracture surgery   . Cesarean section     Family History  Problem Relation Age of Onset  . Diabetes Mother   . Heart failure Mother   . Cancer Mother   . Heart failure Father   . Diabetes Father     History  Substance Use Topics  . Smoking status: Never Smoker   . Smokeless tobacco: Never Used  . Alcohol Use: No    OB History    Grav Para Term Preterm Abortions TAB SAB Ect Mult Living                  Review of Systems  Constitutional: Negative for fever and chills.  Respiratory: Negative for shortness of breath.   Cardiovascular: Negative for chest pain.  Skin: Positive for itching and rash.  Neurological: Negative for weakness and numbness.    Allergies  Sulfa antibiotics  Home  Medications   Current Outpatient Rx  Name Route Sig Dispense Refill  . FERROUS SULFATE 325 (65 FE) MG PO TABS Oral Take 325 mg by mouth daily with breakfast.    . HYDROCORTISONE 1 % EX LOTN Topical Apply topically 2 (two) times daily.    . IBUPROFEN 200 MG PO TABS Oral Take 400 mg by mouth every 8 (eight) hours as needed. For pain.      BP 126/99  Pulse 77  Temp 98.2 F (36.8 C) (Oral)  SpO2 100%  LMP 10/20/2011  Physical Exam  Nursing note and vitals reviewed. Constitutional: She appears well-developed and well-nourished. No distress.  HENT:  Head: Normocephalic and atraumatic.  Neck: Neck supple.  Pulmonary/Chest: Effort normal.  Neurological: She is alert.  Skin: Rash noted. She is not diaphoretic.       ED Course  Procedures (including critical care time)  Labs Reviewed - No data to display No results found.   1. Rash       MDM  Afebrile nontoxic patient with pruritic rash to lower extremities that began following mowing the lawn.  Pt was seen in ED and dx with poison oak, she did not follow treatment plan.  Rash continued, worsened.  Pt also mowed the lawn a second time.  No e/o superinfection.  Pt to be d/c home  with prednisone, pcp resources, return precautions.  Patient verbalizes understanding and agrees with plan.          Dillard Cannon [place], [place] 10/30/11 [phone]

## 2011-10-30 NOTE — Discharge Instructions (Signed)
Read the information below.  Take the medication as directed.  You may continue to take benadryl and use over the counter anti-itch creams as needed.  Return to the ER immediately if you develop pain, pus draining from the wound, or fevers greater than 100.4.  You may return to the ER at any time for worsening condition or any new symptoms that concern you.  Rash A rash is a change in the color or texture of your skin. There are many different types of rashes. You may have other problems that accompany your rash. CAUSES   Infections.   Allergic reactions. This can include allergies to pets or foods.   Certain medicines.   Exposure to certain chemicals, soaps, or cosmetics.   Heat.   Exposure to poisonous plants.   Tumors, both cancerous and noncancerous.  SYMPTOMS   Redness.   Scaly skin.   Itchy skin.   Dry or cracked skin.   Bumps.   Blisters.   Pain.  DIAGNOSIS  Your caregiver may do a physical exam to determine what type of rash you have. A skin sample (biopsy) may be taken and examined under a microscope. TREATMENT  Treatment depends on the type of rash you have. Your caregiver may prescribe certain medicines. For serious conditions, you may need to see a skin doctor (dermatologist). HOME CARE INSTRUCTIONS   Avoid the substance that caused your rash.   Do not scratch your rash. This can cause infection.   You may take cool baths to help stop itching.   Only take over-the-counter or prescription medicines as directed by your caregiver.   Keep all follow-up appointments as directed by your caregiver.  SEEK IMMEDIATE MEDICAL CARE IF:  You have increasing pain, swelling, or redness.   You have a fever.   You have new or severe symptoms.   You have body aches, diarrhea, or vomiting.   Your rash is not better after 3 days.  MAKE SURE YOU:  Understand these instructions.   Will watch your condition.   Will get help right away if you are not doing well  or get worse.  Document Released: 04/14/2002 Document Revised: 04/13/2011 Document Reviewed: 02/06/2011 [facility] Patient Information 2012 [place], [place].  If you have no primary doctor, here are some resources that may be helpful:  Medicaid-accepting [facility] Providers:   - [facility]- [address]      [phone]      Mon-Fri 9am-7pm, Sat 9am-1pm   - [facility]- [address] [place], [place]      [phone]   - [facility]- [address] [place]      [phone]   [facility]- [address]      [phone]   - Renaye Rakers- [address] [place], [address]      [phone]      Only accepts [place] patients       after they have her name applied to their card   Self Pay (no insurance) in [place]:   - Sickle Cell Patients: Dr Willey Blade, [facility] Internal Medicine      [address] [place]      [phone]   - Health [phone]   - Physician Referral Service- [phone]   - [facility]- [address] [place]      [phone]   [facility] [place]- [address]   - [facility]- see information above      (Speak to [employer] if you do not have insurance)   - [facility]- [address] [place]      [phone]   - [facility] [place]- [address]      [phone]   - [facility]- [address]      [phone]   - Dr Julio Sicks-  [address], [address], [place]      [phone]   - [facility]- [address]      [phone]   - [facility]- [address]      [phone]      Also [address]      [phone]   - [facility]- [address]      [phone]      1st and 3rd Saturday every month, 10am-1pm Other agencies that provide inexpensive medical care:    [facility]  [phone]    [facility]   [phone]    [facility]  [phone]    [facility]  [phone]    [facility]  [phone]  General Information: Finding a doctor when you do not have health insurance can be tricky. Although you are not limited by an insurance plan, you are of course limited by her finances and how much but he can pay out of pocket.  What are your options if you don't have health insurance?   1) Find a Librarian, academic and Pay Out of Pocket Although you won't have to find out who is covered by your insurance plan, it is a good idea to ask around and get recommendations. You will then need to call the office and see if the doctor you have chosen will accept you as a new patient and what types of options they offer for patients who are self-pay. Some doctors offer discounts or will set up payment plans for their patients who do not have insurance, but you will need to ask so you aren't surprised when you get to your appointment.  2) Contact Your Local Health Department Not all health departments have doctors that can see patients for sick visits, but many do, so it is worth a call to see if yours does. If you don't know where your local health department is, you can check in your phone book. The CDC also has a tool to help you locate your state's health department, and many state websites also have listings of all of their local health departments.  3) Find a Walk-in Clinic If your illness is not likely to be very severe or complicated, you may want to try a walk in clinic. These are popping up all over the country in pharmacies, drugstores, and shopping centers. They're usually staffed by nurse practitioners or physician assistants that have been trained to treat common illnesses and complaints. They're usually fairly quick and inexpensive. However, if you have serious medical issues or chronic medical problems, these are probably not your best option  RESOURCE GUIDE  Chronic Pain Problems: Contact [facility]  [phone] Patients need to be referred by their primary care doctor.  Insufficient Money for Medicine: Contact United Way:  call "211" or [facility] [phone].  No Primary Care Doctor: - Call Health Connect  [phone] - can  help you locate a primary care doctor that  accepts your insurance, provides certain services, etc. - Physician Referral Service- [phone]  Agencies that provide inexpensive medical care: - [facility]  [phone] - [facility]  [phone] - [facility]  [phone] - Women's Clinic  [phone] - [facility]  [phone] [facility]  [phone]  Medicaid-accepting [facility] Providers: - [facility]- [address]  [phone], Mon-Fri 9am-7pm, Sat 9am-1pm - [facility]- [address] [place], [address] [place]  [phone] - [facility]- [address] [place]  [phone] [facility]- [address]  [phone] - Renaye Rakers- [address] [place], [address], [phone]  Only accepts [place] patients after they have their name  applied to their card  Self Pay (no insurance) in [place]: - Sickle Cell Patients: Dr Willey Blade, [facility]  [address] [place], [phone] - [facility]- [address] [place]  [phone]       [facility] [place]- [address]       -     [facility]- see information above (Speak to [employer] if you do not have insurance)       -  [facility]- [address] [place], [phone]       -  [facility]- [address],  [phone]       -  [facility]- [address], [phone]       -  Dr Julio Sicks-  [address], [place], [phone]       -  [facility]- [address], [phone]       -  [facility] [address], [phone], also [address], [phone]       -    [facility]- [address] [place], [phone], 1st & 3rd Saturday   every month, 10am-1pm  1) Find a Doctor and Pay Out of Pocket Although you won't have to find out who is covered by your insurance plan, it is a good idea to ask around and get recommendations. You will then need to call the office and see if the doctor you have chosen will accept you as a new patient and what types of options they offer for patients who are self-pay. Some doctors offer discounts or will set up payment plans for their patients who do not have insurance, but you will need to ask so you aren't surprised when you get to your appointment.  2) Contact Your Local Health Department Not all health departments have doctors that can see patients for sick visits, but many do, so it is worth a call to see if yours does. If you don't know where your local health department is, you can check in your phone book. The CDC also has a tool to help you locate your state's health department, and many state websites also have listings of all of their local health departments.  3) Find a Walk-in Clinic If your illness is not likely to be very severe or complicated, you may want to try a walk in clinic. These are popping up all over the country in pharmacies, drugstores, and shopping centers. They're usually staffed by  nurse practitioners or physician assistants that have been trained to treat common illnesses and complaints. They're usually fairly quick and inexpensive. However, if you have serious medical issues or chronic medical problems, these are probably not your best option  STD Testing - [facility] of [facility] [place], STD Clinic, [address], [place], phone [phone] or [phone].  Monday - Friday, call for an appointment. [facility] of [employer], STD Clinic, [address],  [place], phone [phone] or [phone].  Monday - Friday, call for an appointment.  Abuse/Neglect: [facility] Child Abuse Hotline 978-585-0115 [facility] Child Abuse Hotline 807-130-5299 (After Hours)  Emergency Shelter:  [facility] 623-378-5197  Maternity Homes: - [facility] [phone] - [facility] [phone]  MRSA Hotline #:   330-756-6992  [facility] Resources  [facility] [place]  United Way [facility] Dept. [address]                 [address]         [address] [place] [address]  Blondell Reveal Phone:  [phone]                                  Phone:  [phone]                   Phone:  [phone]  [facility], [phone] - [phone]       -     [facility] in [place], [address],                                  [phone], [facility] Child Abuse Hotline (862)168-0032 or 716-048-5588 (After Hours)   Behavioral Health Services  Substance Abuse Resources: - [facility] Drug [facility]  418-339-6859 - [facility] 719-596-9693 - [facility] [place] [phone] Floydene Flock [phone] - Residential & Outpatient Substance Abuse Program  [phone]  Psychological Services: [facility]  [phone] Services  [phone] - [facility], [phone] [place]. [address] [place], ACCESS LINE: 5128496054 or 9493983376, [employer]za  Dental Assistance  If unable to pay or uninsured, contact:  [facility] or [facility]. to become qualified for the adult dental clinic.  Patients with Medicaid: [facility] [phone], [phone] [address] [phone]  If unable to pay, or uninsured, contact [facility] [phone]) or [facility] Department [phone] in [place], [phone] in [facility]) to become qualified for the adult dental clinic  Other Low-Cost Community Dental Services: - [facility]- [address] [place], [place], [place], [address], [phone], [phone], 2nd and 4th Thursday of the  month at 6:30am.  10 clients each day by appointment, can sometimes see walk-in patients if someone does not show for an appointment. [facility]- [address] [place] Griffins [place], [place], [address], [phone] - [facility]- [address], [place], [place], [address], [phone] - [facility]- [phone] [facility] Department- [phone] [facility] Department- [phone]

## 2011-10-30 NOTE — ED Notes (Signed)
Pt was seen for same concern on 6/17. Reports increased redness and swelling on legs. Pt was prescribed prednisone, did not get rx filled

## 2011-10-31 NOTE — ED Provider Notes (Signed)
Medical screening examination/treatment/procedure(s) were performed by non-physician practitioner and as supervising physician I was immediately available for consultation/collaboration.    Shearon Clonch R Albirtha Grinage, MD 10/31/11 0705 

## 2012-02-01 ENCOUNTER — Encounter (HOSPITAL_COMMUNITY): Payer: Self-pay | Admitting: Emergency Medicine

## 2012-02-01 ENCOUNTER — Emergency Department (HOSPITAL_COMMUNITY)
Admission: EM | Admit: 2012-02-01 | Discharge: 2012-02-01 | Disposition: A | Discharge status: Home / Self Care | Payer: Self-pay | Attending: Emergency Medicine | Admitting: Emergency Medicine

## 2012-02-01 DIAGNOSIS — J069 Acute upper respiratory infection, unspecified: Secondary | ICD-10-CM

## 2012-02-01 DIAGNOSIS — Z809 Family history of malignant neoplasm, unspecified: Secondary | ICD-10-CM | POA: Insufficient documentation

## 2012-02-01 DIAGNOSIS — Z8249 Family history of ischemic heart disease and other diseases of the circulatory system: Secondary | ICD-10-CM | POA: Insufficient documentation

## 2012-02-01 DIAGNOSIS — Z833 Family history of diabetes mellitus: Secondary | ICD-10-CM | POA: Insufficient documentation

## 2012-02-01 LAB — RAPID STREP SCREEN (MED CTR MEBANE ONLY): Streptococcus, Group A Screen (Direct): NEGATIVE

## 2012-02-01 MED ORDER — HYDROCOD POLST-CHLORPHEN POLST 10-8 MG/5ML PO LQCR: 5.0000 mL | Freq: Two times a day (BID) | ORAL | Status: DC | PRN

## 2012-02-01 NOTE — ED Provider Notes (Signed)
History     CSN: [id number]  Arrival date & time 02/01/12  7829   First MD Initiated Contact with Patient 02/01/12 [phone]      Chief Complaint  Patient presents with  . Nasal Congestion  . Sore Throat    (Consider location/radiation/quality/duration/timing/severity/associated sxs/prior treatment) HPI Comments: Pt with sinus congestion, facial discomfort, head pressure generalized since Thursday, now with mild sore throat, and dry cough since last night.  No obv sick contacts, some achiness, unsure of fevers.  Pt works in [employer], didn't go yesterday and doesn't think she should be around food.  No N/V/D.  Pt is a non smoker.  Has not taken any meds PTA.    Patient is a 46 y.o. female presenting with pharyngitis. The history is provided by the patient.  Sore Throat Associated symptoms include headaches. Pertinent negatives include no shortness of breath.    Past Medical History  Diagnosis Date  . Ovarian cancer     Past Surgical History  Procedure Date  . Hip fracture surgery   . Cesarean section     Family History  Problem Relation Age of Onset  . Diabetes Mother   . Heart failure Mother   . Cancer Mother   . Heart failure Father   . Diabetes Father     History  Substance Use Topics  . Smoking status: Never Smoker   . Smokeless tobacco: Never Used  . Alcohol Use: No    OB History    Grav Para Term Preterm Abortions TAB SAB Ect Mult Living                  Review of Systems  Constitutional: Negative for fever and chills.  HENT: Positive for congestion, sore throat, rhinorrhea, postnasal drip and sinus pressure. Negative for ear pain, trouble swallowing, neck pain, neck stiffness and voice change.   Respiratory: Positive for cough. Negative for shortness of breath and wheezing.   Gastrointestinal: Negative for nausea, vomiting and diarrhea.  Musculoskeletal: Positive for myalgias.  Neurological: Positive for headaches.  Hematological: Negative for  adenopathy.    Allergies  Sulfa antibiotics  Home Medications   Current Outpatient Rx  Name Route Sig Dispense Refill  . PSEUDOEPHEDRINE-ACETAMINOPHEN 30-500 MG PO TABS Oral Take 1 tablet by mouth every 6 (six) hours as needed. For cold symptoms.    Marland Kitchen HYDROCOD POLST-CPM POLST ER 10-8 MG/5ML PO LQCR Oral Take 5 mLs by mouth every 12 (twelve) hours as needed. 80 mL 0    BP 122/77  Pulse 87  Temp 98 F (36.7 C)  Resp 20  SpO2 100%  LMP 12/24/2011  Physical Exam  Nursing note and vitals reviewed. Constitutional: She appears well-developed and well-nourished.  HENT:  Head: Normocephalic and atraumatic.  Right Ear: Hearing, tympanic membrane, external ear and ear canal normal.  Left Ear: Hearing, tympanic membrane, external ear and ear canal normal.  Nose: Mucosal edema present. No nasal deformity. No epistaxis. Right sinus exhibits maxillary sinus tenderness. Right sinus exhibits no frontal sinus tenderness. Left sinus exhibits maxillary sinus tenderness. Left sinus exhibits no frontal sinus tenderness.  Mouth/Throat: Uvula is midline and mucous membranes are normal. Posterior oropharyngeal erythema present. No oropharyngeal exudate, posterior oropharyngeal edema or tonsillar abscesses.  Eyes: Pupils are equal, round, and reactive to light. No scleral icterus.  Neck: Normal range of motion. Neck supple.  Cardiovascular: Normal rate.   Pulmonary/Chest: Effort normal. No stridor. No respiratory distress. She has no wheezes. She has no rales.  Abdominal: Soft.  Lymphadenopathy:    She has no cervical adenopathy.  Neurological: She is alert.  Skin: Skin is warm. No rash noted.  Psychiatric: She has a normal mood and affect.    ED Course  Procedures (including critical care time)   Labs Reviewed  RAPID STREP SCREEN   No results found.   1. Upper respiratory infection     9:43 AM Rapid strep neg.    MDM  Pt likely with viral sinus infection, URI with associoated post  nasal drip and resulting sore throat and mild cough.  Lungs clear.  RA sat are 100% and I interpret to be normal.  Will check strep.  Otherwise she is encouraged to get OTC meds, will give Rx for tussionex and work note.          Gavin Pound. Oletta Lamas, MD 02/01/12 [phone]

## 2012-02-01 NOTE — ED Notes (Signed)
Pt presenting to ed with c/o congestion pt states her head feels very stuffy and her throat is sore and it "feels like it's on fire". Pt states she's not coughing anything up at this time.

## 2012-02-01 NOTE — Discharge Instructions (Signed)
 Antibiotic Nonuse  Your caregiver felt that the infection or problem was not one that would be helped with an antibiotic. Infections may be caused by viruses or bacteria. Only a caregiver can tell which one of these is the likely cause of an illness. A cold is the most common cause of infection in both adults and children. A cold is a virus. Antibiotic treatment will have no effect on a viral infection. Viruses can lead to many lost days of work caring for sick children and many missed days of school. Children may catch as many as 10 colds or flus per year during which they can be tearful, cranky, and uncomfortable. The goal of treating a virus is aimed at keeping the ill person comfortable. Antibiotics are medications used to help the body fight bacterial infections. There are relatively few types of bacteria that cause infections but there are hundreds of viruses. While both viruses and bacteria cause infection they are very different types of germs. A viral infection will typically go away by itself within 7 to 10 days. Bacterial infections may spread or get worse without antibiotic treatment. Examples of bacterial infections are:  Sore throats (like strep throat or tonsillitis).   Infection in the lung (pneumonia).   Ear and skin infections.  Examples of viral infections are:  Colds or flus.   Most coughs and bronchitis.   Sore throats not caused by Strep.   Runny noses.  It is often best not to take an antibiotic when a viral infection is the cause of the problem. Antibiotics can kill off the helpful bacteria that we have inside our body and allow harmful bacteria to start growing. Antibiotics can cause side effects such as allergies, nausea, and diarrhea without helping to improve the symptoms of the viral infection. Additionally, repeated uses of antibiotics can cause bacteria inside of our body to become resistant. That resistance can be passed onto harmful bacterial. The next time  you have an infection it may be harder to treat if antibiotics are used when they are not needed. Not treating with antibiotics allows our own immune system to develop and take care of infections more efficiently. Also, antibiotics will work better for us  when they are prescribed for bacterial infections. Treatments for a child that is ill may include:  Give extra fluids throughout the day to stay hydrated.   Get plenty of rest.   Only give your child over-the-counter or prescription medicines for pain, discomfort, or fever as directed by your caregiver.   The use of a cool mist humidifier may help stuffy noses.   Cold medications if suggested by your caregiver.  Your caregiver may decide to start you on an antibiotic if:  The problem you were seen for today continues for a longer length of time than expected.   You develop a secondary bacterial infection.  SEEK MEDICAL CARE IF:  Fever lasts longer than 5 days.   Symptoms continue to get worse after 5 to 7 days or become severe.   Difficulty in breathing develops.   Signs of dehydration develop (poor drinking, rare urinating, dark colored urine).   Changes in behavior or worsening tiredness (listlessness or lethargy).  Document Released: 07/03/2001 Document Revised: 04/13/2011 Document Reviewed: 12/30/2008 [facility] Patient Information 2012 [place], [place].    Upper Respiratory Infection, Adult An upper respiratory infection (URI) is also sometimes known as the common cold. The upper respiratory tract includes the nose, sinuses, throat, trachea, and bronchi. Bronchi are the airways leading  to the lungs. Most people improve within 1 week, but symptoms can last up to 2 weeks. A residual cough may last even longer.  CAUSES Many different viruses can infect the tissues lining the upper respiratory tract. The tissues become irritated and inflamed and often become very moist. Mucus production is also common. A cold is contagious. You can  easily spread the virus to others by oral contact. This includes kissing, sharing a glass, coughing, or sneezing. Touching your mouth or nose and then touching a surface, which is then touched by another person, can also spread the virus. SYMPTOMS  Symptoms typically develop 1 to 3 days after you come in contact with a cold virus. Symptoms vary from person to person. They may include:  Runny nose.   Sneezing.   Nasal congestion.   Sinus irritation.   Sore throat.   Loss of voice (laryngitis).   Cough.   Fatigue.   Muscle aches.   Loss of appetite.   Headache.   Low-grade fever.  DIAGNOSIS  You might diagnose your own cold based on familiar symptoms, since most people get a cold 2 to 3 times a year. Your caregiver can confirm this based on your exam. Most importantly, your caregiver can check that your symptoms are not due to another disease such as strep throat, sinusitis, pneumonia, asthma, or epiglottitis. Blood tests, throat tests, and X-rays are not necessary to diagnose a common cold, but they may sometimes be helpful in excluding other more serious diseases. Your caregiver will decide if any further tests are required. RISKS AND COMPLICATIONS  You may be at risk for a more severe case of the common cold if you smoke cigarettes, have chronic heart disease (such as heart failure) or lung disease (such as asthma), or if you have a weakened immune system. The very young and very old are also at risk for more serious infections. Bacterial sinusitis, middle ear infections, and bacterial pneumonia can complicate the common cold. The common cold can worsen asthma and chronic obstructive pulmonary disease (COPD). Sometimes, these complications can require emergency medical care and may be life-threatening. PREVENTION  The best way to protect against getting a cold is to practice good hygiene. Avoid oral or hand contact with people with cold symptoms. Wash your hands often if contact  occurs. There is no clear evidence that vitamin C, vitamin E, echinacea, or exercise reduces the chance of developing a cold. However, it is always recommended to get plenty of rest and practice good nutrition. TREATMENT  Treatment is directed at relieving symptoms. There is no cure. Antibiotics are not effective, because the infection is caused by a virus, not by bacteria. Treatment may include:  Increased fluid intake. Sports drinks offer valuable electrolytes, sugars, and fluids.   Breathing heated mist or steam (vaporizer or shower).   Eating chicken soup or other clear broths, and maintaining good nutrition.   Getting plenty of rest.   Using gargles or lozenges for comfort.   Controlling fevers with ibuprofen  or acetaminophen as directed by your caregiver.   Increasing usage of your inhaler if you have asthma.  Zinc gel and zinc lozenges, taken in the first 24 hours of the common cold, can shorten the duration and lessen the severity of symptoms. Pain medicines may help with fever, muscle aches, and throat pain. A variety of non-prescription medicines are available to treat congestion and runny nose. Your caregiver can make recommendations and may suggest nasal or lung inhalers for other symptoms.  HOME CARE INSTRUCTIONS   Only take over-the-counter or prescription medicines for pain, discomfort, or fever as directed by your caregiver.   Use a warm mist humidifier or inhale steam from a shower to increase air moisture. This may keep secretions moist and make it easier to breathe.   Drink enough water and fluids to keep your urine clear or pale yellow.   Rest as needed.   Return to work when your temperature has returned to normal or as your caregiver advises. You may need to stay home longer to avoid infecting others. You can also use a face mask and careful hand washing to prevent spread of the virus.  SEEK MEDICAL CARE IF:   After the first few days, you feel you are getting  worse rather than better.   You need your caregiver's advice about medicines to control symptoms.   You develop chills, worsening shortness of breath, or brown or red sputum. These may be signs of pneumonia.   You develop yellow or brown nasal discharge or pain in the face, especially when you bend forward. These may be signs of sinusitis.   You develop a fever, swollen neck glands, pain with swallowing, or white areas in the back of your throat. These may be signs of strep throat.  SEEK IMMEDIATE MEDICAL CARE IF:   You have a fever.   You develop severe or persistent headache, ear pain, sinus pain, or chest pain.   You develop wheezing, a prolonged cough, cough up blood, or have a change in your usual mucus (if you have chronic lung disease).   You develop sore muscles or a stiff neck.  Document Released: 10/18/2000 Document Revised: 04/13/2011 Document Reviewed: 08/26/2010 [facility] Patient Information 2012 [place], [place].    Narcotic and benzodiazepine use may cause drowsiness, slowed breathing or dependence.  Please use with caution and do not drive, operate machinery or watch young children alone while taking them.  Taking combinations of these medications or drinking alcohol will potentiate these effects.

## 2012-02-01 NOTE — ED Notes (Signed)
Patient states she has had a dry cough, her throat feels like it's on fire, and denies fevers.  Patient took one Tylenol Cold at home with no improvement.  No redness or swollen tonsils noted.

## 2012-07-13 ENCOUNTER — Encounter (HOSPITAL_COMMUNITY): Payer: Self-pay | Admitting: Emergency Medicine

## 2012-07-13 ENCOUNTER — Emergency Department (HOSPITAL_COMMUNITY)
Admission: EM | Admit: 2012-07-13 | Discharge: 2012-07-13 | Disposition: A | Discharge status: Home / Self Care | Payer: Self-pay | Attending: Emergency Medicine | Admitting: Emergency Medicine

## 2012-07-13 DIAGNOSIS — H66019 Acute suppurative otitis media with spontaneous rupture of ear drum, unspecified ear: Secondary | ICD-10-CM | POA: Insufficient documentation

## 2012-07-13 DIAGNOSIS — H9210 Otorrhea, unspecified ear: Secondary | ICD-10-CM | POA: Insufficient documentation

## 2012-07-13 DIAGNOSIS — H9190 Unspecified hearing loss, unspecified ear: Secondary | ICD-10-CM | POA: Insufficient documentation

## 2012-07-13 DIAGNOSIS — Z8543 Personal history of malignant neoplasm of ovary: Secondary | ICD-10-CM | POA: Insufficient documentation

## 2012-07-13 DIAGNOSIS — H9319 Tinnitus, unspecified ear: Secondary | ICD-10-CM | POA: Insufficient documentation

## 2012-07-13 MED ORDER — AMOXICILLIN 500 MG PO CAPS: 500.0000 mg | ORAL_CAPSULE | Freq: Three times a day (TID) | ORAL | Status: DC

## 2012-07-13 NOTE — ED Provider Notes (Signed)
History     CSN: [id number]  Arrival date & time 07/13/12  1249   First MD Initiated Contact with Patient 07/13/12 1412      Chief Complaint  Patient presents with  . Otalgia    (Consider location/radiation/quality/duration/timing/severity/associated sxs/prior treatment) HPI  Brenda Gallagher is a 47 y.o. female complaining of left ear draining purulent material for the last 7 days in addition to decreased hearing acuity and tinnitus. Patient denies fever, rhinorrhea, prodrome of otalgia before they drainage,History of frequent ear infections or sinus congestion. She rates her pain is mild 2/10 no exacerbating or alleviating factors identified. She has not been on antibiotics the last 2 months.   Past Medical History  Diagnosis Date  . Ovarian cancer     Past Surgical History  Procedure Laterality Date  . Hip fracture surgery    . Cesarean section      Family History  Problem Relation Age of Onset  . Diabetes Mother   . Heart failure Mother   . Cancer Mother   . Heart failure Father   . Diabetes Father     History  Substance Use Topics  . Smoking status: Never Smoker   . Smokeless tobacco: Never Used  . Alcohol Use: No    OB History   Grav Para Term Preterm Abortions TAB SAB Ect Mult Living                  Review of Systems  Constitutional: Negative for fever.  HENT: Positive for hearing loss, ear pain, tinnitus and ear discharge.   Respiratory: Negative for shortness of breath.   Cardiovascular: Negative for chest pain.  Gastrointestinal: Negative for nausea, vomiting, abdominal pain and diarrhea.  All other systems reviewed and are negative.    Allergies  Sulfa antibiotics  Home Medications   Current Outpatient Rx  Name  Route  Sig  Dispense  Refill  . pseudoephedrine (SUDAFED) 30 MG tablet   Oral   Take 30 mg by mouth every 4 (four) hours as needed for congestion. Cold symptoms         . pseudoephedrine-acetaminophen (TYLENOL SINUS) 30-500  MG TABS   Oral   Take 1 tablet by mouth every 6 (six) hours as needed. For cold symptoms.           BP 138/77  Pulse 98  Temp(Src) 98.3 F (36.8 C) (Oral)  Resp 20  SpO2 99%  LMP 07/03/2012  Physical Exam  Nursing note and vitals reviewed. Constitutional: She is oriented to person, place, and time. She appears well-developed and well-nourished. No distress.  HENT:  Head: Normocephalic.  Left ear tympanic membrane rupture with significant purulent discharge. No mastoid tenderness. Right TM normal architecture with good light reflex. Posterior pharynx is clear with no injection. No tenderness to palpation of the facial sinuses.  Eyes: Conjunctivae and EOM are normal.  Cardiovascular: Normal rate.   Pulmonary/Chest: Effort normal. No stridor.  Musculoskeletal: Normal range of motion.  Neurological: She is alert and oriented to person, place, and time.  Psychiatric: She has a normal mood and affect.    ED Course  Procedures (including critical care time)  Labs Reviewed - No data to display No results found.   1. Acute suppurative otitis media with spontaneous rupture of tympanic membrane, right       MDM   Brenda Gallagher is a 47 y.o. female with left-sided tympanic membrane rupture and acute otitis media. Counseled patient not to expose here  to water. ENT referral given   Filed Vitals:   07/13/12 1337  BP: 138/77  Pulse: 98  Temp: 98.3 F (36.8 C)  TempSrc: Oral  Resp: 20  SpO2: 99%     Pt verbalized understanding and agrees with care plan. Outpatient follow-up and return precautions given.    Discharge Medication List as of 07/13/2012  2:23 PM    START taking these medications   Details  amoxicillin (AMOXIL) 500 MG capsule Take 1 capsule (500 mg total) by mouth 3 (three) times daily., Starting 07/13/2012, Until Discontinued, [employer], PA-C 07/13/12 1626

## 2012-07-13 NOTE — ED Notes (Signed)
Pt states she has had left ear pain/drainage x 1 wk.  States it sounds like there is water in it.

## 2012-07-14 NOTE — ED Provider Notes (Signed)
Medical screening examination/treatment/procedure(s) were performed by non-physician practitioner and as supervising physician I was immediately available for consultation/collaboration.  Douglas Delo, MD 07/14/12 1548 

## 2012-11-21 ENCOUNTER — Emergency Department (HOSPITAL_COMMUNITY)
Admission: EM | Admit: 2012-11-21 | Discharge: 2012-11-21 | Disposition: A | Discharge status: Home / Self Care | Payer: Self-pay | Attending: Emergency Medicine | Admitting: Emergency Medicine

## 2012-11-21 ENCOUNTER — Encounter (HOSPITAL_COMMUNITY): Payer: Self-pay | Admitting: *Deleted

## 2012-11-21 ENCOUNTER — Emergency Department (HOSPITAL_COMMUNITY): Payer: Self-pay

## 2012-11-21 DIAGNOSIS — R0602 Shortness of breath: Secondary | ICD-10-CM | POA: Insufficient documentation

## 2012-11-21 DIAGNOSIS — Z8543 Personal history of malignant neoplasm of ovary: Secondary | ICD-10-CM | POA: Insufficient documentation

## 2012-11-21 DIAGNOSIS — J40 Bronchitis, not specified as acute or chronic: Secondary | ICD-10-CM | POA: Insufficient documentation

## 2012-11-21 DIAGNOSIS — R05 Cough: Secondary | ICD-10-CM

## 2012-11-21 MED ORDER — ALBUTEROL SULFATE HFA 108 (90 BASE) MCG/ACT IN AERS
2.0000 | INHALATION_SPRAY | Freq: Once | RESPIRATORY_TRACT | Status: AC
  Administered 2012-11-21: 2 via RESPIRATORY_TRACT
  Filled 2012-11-21: qty 6.7

## 2012-11-21 NOTE — ED Notes (Signed)
Pt escorted to discharge window. Verbalized understanding discharge instructions. In no acute distress.   

## 2012-11-21 NOTE — ED Notes (Addendum)
Per pt report: pt c/o of a cough that "almost takes my breath away."  Pt has a cough on and off for a couple of weeks but this episode began on Monday.  Pt c/o of throat being sore and burning and the ear having some drainage. Pt has a strong cough and reports coughing up mucus. Pt reports taking an antibiotic and sudafed without any relief.

## 2012-11-21 NOTE — ED Provider Notes (Signed)
History    CSN: [id number] Arrival date & time 11/21/12  0550  First MD Initiated Contact with Patient 11/21/12 0600     Chief Complaint  Patient presents with  . Cough   (Consider location/radiation/quality/duration/timing/severity/associated sxs/prior Treatment) The history is provided by the patient.   patient presents with several weeks of ongoing cough.  She reports this cough is productive and she coughs up mucus.  Reports that she's taking antibiotic but she does not member the name of as well as Sudafed without any improvement in her symptoms.  No history of asthma.  She has some sore throat and bilateral ear pain as well.  No fevers chills.  His shortness of breath.  No history of DVT or pulmonary embolism.  Symptoms are mild in severity.  Nothing worsens or improves the Past Medical History  Diagnosis Date  . Ovarian cancer    Past Surgical History  Procedure Laterality Date  . Hip fracture surgery    . Cesarean section     Family History  Problem Relation Age of Onset  . Diabetes Mother   . Heart failure Mother   . Cancer Mother   . Heart failure Father   . Diabetes Father    History  Substance Use Topics  . Smoking status: Never Smoker   . Smokeless tobacco: Never Used  . Alcohol Use: No   OB History   Grav Para Term Preterm Abortions TAB SAB Ect Mult Living                 Review of Systems  Respiratory: Positive for cough.   All other systems reviewed and are negative.    Allergies  Sulfa antibiotics  Home Medications   Current Outpatient Rx  Name  Route  Sig  Dispense  Refill  . pseudoephedrine (SUDAFED) 30 MG tablet   Oral   Take 30 mg by mouth every 4 (four) hours as needed for congestion. Cold symptoms          BP 121/81  Pulse 117  Temp(Src) 98.3 F (36.8 C)  Resp 18  Ht 5' (1.524 m)  Wt 135 lb (61.236 kg)  BMI 26.37 kg/m2  SpO2 98%  LMP 10/17/2012 Physical Exam  Nursing note and vitals reviewed. Constitutional: She is  oriented to person, place, and time. She appears well-developed and well-nourished. No distress.  HENT:  Head: Normocephalic and atraumatic.  Eyes: EOM are normal.  Neck: Normal range of motion.  Cardiovascular: Normal rate, regular rhythm and normal heart sounds.   Pulmonary/Chest: Effort normal and breath sounds normal. She has no wheezes. She has no rales.  Abdominal: Soft. She exhibits no distension. There is no tenderness.  Musculoskeletal: Normal range of motion. She exhibits no edema.  Neurological: She is alert and oriented to person, place, and time.  Skin: Skin is warm and dry.  Psychiatric: She has a normal mood and affect. Judgment normal.    ED Course  Procedures (including critical care time) Labs Reviewed - No data to display Dg Chest 2 View  11/21/2012   *RADIOLOGY REPORT*  Clinical Data: Cough and congestion  CHEST - 2 VIEW  Comparison:  11/05/2010  Findings:  The heart size and mediastinal contours are within normal limits.  Both lungs are clear.  The visualized skeletal structures are unremarkable.  IMPRESSION: No active cardiopulmonary disease.   Original Report Authenticated By: Janeece Riggers, M.D.   I personally reviewed the imaging tests through PACS system I reviewed available ER/hospitalization  records through the EMR   1. Cough   2. Bronchitis     MDM  7:19 AM Patient feels much better after albuterol.  This is likely bronchitis with possible bronchospasm.  Discharge home in good condition.  She'll need a primary care physician.  I've asked that she return to the ER for new or worsening symptoms  Lyanne Co, MD 11/21/12 [phone]

## 2014-05-05 ENCOUNTER — Emergency Department (HOSPITAL_COMMUNITY)
Admission: EM | Admit: 2014-05-05 | Discharge: 2014-05-05 | Disposition: A | Discharge status: Home / Self Care | Payer: Self-pay | Attending: Emergency Medicine | Admitting: Emergency Medicine

## 2014-05-05 ENCOUNTER — Emergency Department (HOSPITAL_COMMUNITY): Payer: Self-pay

## 2014-05-05 ENCOUNTER — Encounter (HOSPITAL_COMMUNITY): Payer: Self-pay | Admitting: Emergency Medicine

## 2014-05-05 DIAGNOSIS — J40 Bronchitis, not specified as acute or chronic: Secondary | ICD-10-CM | POA: Insufficient documentation

## 2014-05-05 DIAGNOSIS — Z8781 Personal history of (healed) traumatic fracture: Secondary | ICD-10-CM | POA: Insufficient documentation

## 2014-05-05 DIAGNOSIS — Z8543 Personal history of malignant neoplasm of ovary: Secondary | ICD-10-CM | POA: Insufficient documentation

## 2014-05-05 MED ORDER — PREDNISONE 20 MG PO TABS: ORAL_TABLET | ORAL | Status: DC

## 2014-05-05 MED ORDER — ALBUTEROL SULFATE HFA 108 (90 BASE) MCG/ACT IN AERS
2.0000 | INHALATION_SPRAY | RESPIRATORY_TRACT | Status: DC | PRN
  Administered 2014-05-05: 2 via RESPIRATORY_TRACT
  Filled 2014-05-05: qty 6.7

## 2014-05-05 MED ORDER — IPRATROPIUM-ALBUTEROL 0.5-2.5 (3) MG/3ML IN SOLN
3.0000 mL | Freq: Once | RESPIRATORY_TRACT | Status: AC
  Administered 2014-05-05: 3 mL via RESPIRATORY_TRACT
  Filled 2014-05-05: qty 3

## 2014-05-05 MED ORDER — PREDNISONE 20 MG PO TABS
60.0000 mg | ORAL_TABLET | Freq: Once | ORAL | Status: AC
  Administered 2014-05-05: 60 mg via ORAL
  Filled 2014-05-05: qty 3

## 2014-05-05 MED ORDER — HYDROCOD POLST-CHLORPHEN POLST 10-8 MG/5ML PO LQCR: 5.0000 mL | Freq: Two times a day (BID) | ORAL | Status: DC | PRN

## 2014-05-05 NOTE — ED Notes (Signed)
Pt c/o cough and congestion, has hx of bronchitis.

## 2014-05-05 NOTE — ED Provider Notes (Signed)
CSN: [id number]     Arrival date & time 05/05/14  6237 History   First MD Initiated Contact with Patient 05/05/14 [phone]     Chief Complaint  Patient presents with  . Cough  . congestion      (Consider location/radiation/quality/duration/timing/severity/associated sxs/prior Treatment) HPI  Pt is a 48yo female presenting to ED with c/o cough and congestion x3 week, gradually worsening with associated voice changes and side pain that pt attributes to her coughing.  States she has used several bottles of robitussin since onset of symptoms w/o relief.  Nothing makes symptoms better or worse. Reports mild throat pain but denies difficulty breathing or swallowing. Denies fever, chills, n/v/d. No sick contacts or recent travel. Denies hx of asthma but does report getting sick this time last year and needing an albuterol inhaler at that time for bronchitis. Pt has never been a smoker.   Past Medical History  Diagnosis Date  . Ovarian cancer    Past Surgical History  Procedure Laterality Date  . Hip fracture surgery    . Cesarean section     Family History  Problem Relation Age of Onset  . Diabetes Mother   . Heart failure Mother   . Cancer Mother   . Heart failure Father   . Diabetes Father    History  Substance Use Topics  . Smoking status: Never Smoker   . Smokeless tobacco: Never Used  . Alcohol Use: No   OB History    No data available     Review of Systems  Constitutional: Negative for fever and chills.  HENT: Positive for congestion, sore throat and voice change. Negative for ear pain, rhinorrhea and trouble swallowing.   Respiratory: Positive for cough, shortness of breath and wheezing. Negative for chest tightness and stridor.   Cardiovascular: Positive for chest pain ( sides "from coughing"). Negative for palpitations and leg swelling.  Gastrointestinal: Negative for nausea, vomiting, abdominal pain and diarrhea.  All other systems reviewed and are  negative.     Allergies  Sulfa antibiotics  Home Medications   Prior to Admission medications   Medication Sig Start Date End Date Taking? Authorizing Provider  pseudoephedrine (SUDAFED) 30 MG tablet Take 30 mg by mouth every 4 (four) hours as needed for congestion. Cold symptoms   Yes Historical Provider, MD  chlorpheniramine-HYDROcodone (TUSSIONEX PENNKINETIC ER) 10-8 MG/5ML LQCR Take 5 mLs by mouth every 12 (twelve) hours as needed for cough. 05/05/14   Noland Fordyce, PA-C  predniSONE (DELTASONE) 20 MG tablet 2 tabs po daily x 3 days 05/05/14   Noland Fordyce, PA-C   BP 146/92 mmHg  Pulse 88  Temp(Src) 97.6 F (36.4 C) (Oral)  Resp 18  SpO2 99%  LMP 04/02/2014 (Approximate) Physical Exam  Constitutional: She appears well-developed and well-nourished. No distress.  HENT:  Head: Normocephalic and atraumatic.  Right Ear: Hearing, tympanic membrane, external ear and ear canal normal.  Left Ear: Hearing, tympanic membrane, external ear and ear canal normal.  Nose: Mucosal edema and rhinorrhea present.  Mouth/Throat: Uvula is midline and mucous membranes are normal. Posterior oropharyngeal erythema present. No oropharyngeal exudate, posterior oropharyngeal edema or tonsillar abscesses.  Eyes: Conjunctivae are normal. No scleral icterus.  Neck: Normal range of motion. No tracheal deviation present.  Cardiovascular: Normal rate, regular rhythm and normal heart sounds.   Pulmonary/Chest: Effort normal. No stridor. No respiratory distress. She has wheezes ( diffuse inspiratory and expiratory). She has no rales. She exhibits no tenderness.  Mild respiratory distress,  able to speak in full sentences. No accessory muscle use, however, frequent dry hacking cough between sentences.  Abdominal: Soft. Bowel sounds are normal. She exhibits no distension and no mass. There is no tenderness. There is no rebound and no guarding.  Musculoskeletal: Normal range of motion.  Lymphadenopathy:    She  has no cervical adenopathy.  Neurological: She is alert.  Skin: Skin is warm and dry. She is not diaphoretic.  Nursing note and vitals reviewed.   ED Course  Procedures (including critical care time) Labs Review Labs Reviewed - No data to display  Imaging Review Dg Chest 2 View  05/05/2014   CLINICAL DATA:  Nonsmoker. Cough and chest congestion, remitting. Duration of symptoms several weeks.  EXAM: CHEST  2 VIEW  COMPARISON:  11/21/2012  FINDINGS: Heart size is normal. Mediastinal shadows are normal. The lungs are clear. No effusions. No bony abnormalities.  IMPRESSION: Normal chest   Electronically Signed   By: Nelson Chimes M.D.   On: 05/05/2014 09:12     EKG Interpretation None      MDM   Final diagnoses:  Bronchitis   Pt is a 48yo female with hx of bronchitis presenting to ED with c/o cough and congestion x3 weeks. Pt is not a smoker, no hx of asthma. Doubt ACS or PE.  CXR performed to r/o pneumonia. CXR: normal Will tx as bronchitis as pt does have significant dry hacking cough on exam.  Cough and lung sounds improved after pt given prednisone and duoneb in ED.  Will discharge home with Rx: prednisone, albuterol inhaler, and tussionex. Advised to f/u with PCP in 4-5 days for recheck of symptoms if not improving. Return precautions provided. Pt verbalized understanding and agreement with tx plan.   Noland Fordyce, PA-C 05/05/14 [place], MD 05/06/14 [phone]

## 2014-05-05 NOTE — Discharge Instructions (Signed)
Tussinex is a strong cough medication that can make you drowsy.  Do not drive or operate heavy machinery while taking.   You may also use over the counter cough drops during the day to help sooth throat and cough. Be sure to drink at least 8 glasses (8oz) of water a day and to get at least 8 hours of sleep per night. You may take acetaminophen and ibuprofen as needed for fever and pain. See below for further instructions.   Cool Mist Vaporizers Vaporizers may help relieve the symptoms of a cough and cold. They add moisture to the air, which helps mucus to become thinner and less sticky. This makes it easier to breathe and cough up secretions. Cool mist vaporizers do not cause serious burns like hot mist vaporizers, which may also be called steamers or humidifiers. Vaporizers have not been proven to help with colds. You should not use a vaporizer if you are allergic to mold. HOME CARE INSTRUCTIONS  Follow the package instructions for the vaporizer.  Do not use anything other than distilled water in the vaporizer.  Do not run the vaporizer all of the time. This can cause mold or bacteria to grow in the vaporizer.  Clean the vaporizer after each time it is used.  Clean and dry the vaporizer well before storing it.  Stop using the vaporizer if worsening respiratory symptoms develop. Document Released: 01/20/2004 Document Revised: 04/29/2013 Document Reviewed: 09/11/2012 [facility] Patient Information 2015 [place], [place]. This information is not intended to replace advice given to you by your health care provider. Make sure you discuss any questions you have with your health care provider.

## 2019-02-11 ENCOUNTER — Other Ambulatory Visit: Payer: Self-pay

## 2019-02-11 ENCOUNTER — Emergency Department (HOSPITAL_COMMUNITY)
Admission: EM | Admit: 2019-02-11 | Discharge: 2019-02-11 | Disposition: A | Discharge status: Home / Self Care | Payer: No Typology Code available for payment source | Attending: Emergency Medicine | Admitting: Emergency Medicine

## 2019-02-11 ENCOUNTER — Encounter (HOSPITAL_COMMUNITY): Payer: Self-pay | Admitting: Emergency Medicine

## 2019-02-11 ENCOUNTER — Emergency Department (HOSPITAL_COMMUNITY): Payer: No Typology Code available for payment source

## 2019-02-11 DIAGNOSIS — Y92410 Unspecified street and highway as the place of occurrence of the external cause: Secondary | ICD-10-CM | POA: Diagnosis not present

## 2019-02-11 DIAGNOSIS — Y9389 Activity, other specified: Secondary | ICD-10-CM | POA: Diagnosis not present

## 2019-02-11 DIAGNOSIS — Y998 Other external cause status: Secondary | ICD-10-CM | POA: Insufficient documentation

## 2019-02-11 DIAGNOSIS — Z8543 Personal history of malignant neoplasm of ovary: Secondary | ICD-10-CM | POA: Diagnosis not present

## 2019-02-11 DIAGNOSIS — S39012A Strain of muscle, fascia and tendon of lower back, initial encounter: Secondary | ICD-10-CM | POA: Insufficient documentation

## 2019-02-11 DIAGNOSIS — S3992XA Unspecified injury of lower back, initial encounter: Secondary | ICD-10-CM | POA: Diagnosis present

## 2019-02-11 MED ORDER — LIDOCAINE 5 % EX PTCH: 1.0000 | MEDICATED_PATCH | CUTANEOUS | 0 refills | Status: AC

## 2019-02-11 MED ORDER — LIDOCAINE 5 % EX PTCH
1.0000 | MEDICATED_PATCH | CUTANEOUS | Status: DC
  Administered 2019-02-11: 1 via TRANSDERMAL
  Filled 2019-02-11: qty 1

## 2019-02-11 MED ORDER — CYCLOBENZAPRINE HCL 10 MG PO TABS: 10.0000 mg | ORAL_TABLET | Freq: Two times a day (BID) | ORAL | 0 refills | Status: AC | PRN

## 2019-02-11 MED ORDER — IBUPROFEN 800 MG PO TABS
800.0000 mg | ORAL_TABLET | Freq: Once | ORAL | Status: AC
  Administered 2019-02-11: 19:00:00 800 mg via ORAL
  Filled 2019-02-11: qty 1

## 2019-02-11 MED ORDER — CYCLOBENZAPRINE HCL 10 MG PO TABS
10.0000 mg | ORAL_TABLET | Freq: Once | ORAL | Status: AC
  Administered 2019-02-11: 19:00:00 10 mg via ORAL
  Filled 2019-02-11: qty 1
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
## 2019-02-11 NOTE — ED Notes (Signed)
Pt verbalized discharge instructions and follow up care. Alert and ambulatory without assistance. Gait steady. No IV. Has ride home.
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
## 2019-02-13 ENCOUNTER — Other Ambulatory Visit: Payer: Self-pay

## 2019-02-13 ENCOUNTER — Encounter: Payer: Self-pay | Admitting: Emergency Medicine

## 2019-02-13 ENCOUNTER — Ambulatory Visit
Admission: EM | Admit: 2019-02-13 | Discharge: 2019-02-13 | Disposition: A | Discharge status: Home / Self Care | Payer: Self-pay | Attending: Physician Assistant | Admitting: Physician Assistant

## 2019-02-13 DIAGNOSIS — M545 Low back pain, unspecified: Secondary | ICD-10-CM

## 2019-02-13 DIAGNOSIS — M25551 Pain in right hip: Secondary | ICD-10-CM

## 2019-02-13 DIAGNOSIS — M25552 Pain in left hip: Secondary | ICD-10-CM

## 2019-02-13 MED ORDER — MELOXICAM 7.5 MG PO TABS: 7.5000 mg | ORAL_TABLET | Freq: Every day | ORAL | 0 refills | Status: DC
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
## 2019-02-13 NOTE — ED Notes (Signed)
Patient able to ambulate independently  
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
## 2019-02-18 ENCOUNTER — Ambulatory Visit
Admission: EM | Admit: 2019-02-18 | Discharge: 2019-02-18 | Disposition: A | Discharge status: Home / Self Care | Payer: Self-pay | Attending: Physician Assistant | Admitting: Physician Assistant

## 2019-02-18 ENCOUNTER — Encounter: Payer: Self-pay | Admitting: Emergency Medicine

## 2019-02-18 ENCOUNTER — Other Ambulatory Visit: Payer: Self-pay

## 2019-02-18 DIAGNOSIS — M79604 Pain in right leg: Secondary | ICD-10-CM

## 2019-02-18 DIAGNOSIS — M79605 Pain in left leg: Secondary | ICD-10-CM

## 2019-02-18 DIAGNOSIS — M545 Low back pain, unspecified: Secondary | ICD-10-CM

## 2019-02-18 MED ORDER — PREDNISONE 50 MG PO TABS: 50.0000 mg | ORAL_TABLET | Freq: Every day | ORAL | 0 refills | Status: AC

## 2019-02-18 MED ORDER — PREDNISONE 50 MG PO TABS: 50.0000 mg | ORAL_TABLET | Freq: Every day | ORAL | 0 refills | Status: DC

## 2019-02-18 NOTE — ED Notes (Signed)
Patient able to ambulate independently  

## 2019-02-18 NOTE — ED Triage Notes (Signed)
Pt presents to [facility] for continued and worsening lower back pain and bilateral lower leg burning (to thighs).  Also c/o left foot pain. Patient states she worked 4 days straight over the weekend, and pain seems to be worsening.

## 2019-02-18 NOTE — Discharge Instructions (Signed)
Start prednisone as directed. Discontinue meloxicam (mobic). Follow up with sports medicine if symptoms still not improving.
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
## 2021-02-07 ENCOUNTER — Emergency Department (HOSPITAL_COMMUNITY)
Admission: EM | Admit: 2021-02-07 | Discharge: 2021-02-07 | Disposition: A | Discharge status: Home / Self Care | Payer: Self-pay | Attending: Emergency Medicine | Admitting: Emergency Medicine

## 2021-02-07 ENCOUNTER — Encounter (HOSPITAL_COMMUNITY): Payer: Self-pay

## 2021-02-07 ENCOUNTER — Emergency Department (HOSPITAL_COMMUNITY): Payer: Self-pay

## 2021-02-07 DIAGNOSIS — Z8543 Personal history of malignant neoplasm of ovary: Secondary | ICD-10-CM | POA: Insufficient documentation

## 2021-02-07 DIAGNOSIS — Y9301 Activity, walking, marching and hiking: Secondary | ICD-10-CM | POA: Insufficient documentation

## 2021-02-07 DIAGNOSIS — W19XXXA Unspecified fall, initial encounter: Secondary | ICD-10-CM | POA: Insufficient documentation

## 2021-02-07 DIAGNOSIS — S300XXA Contusion of lower back and pelvis, initial encounter: Secondary | ICD-10-CM | POA: Insufficient documentation

## 2021-02-07 MED ORDER — LIDOCAINE 5 % EX PTCH: 1.0000 | MEDICATED_PATCH | CUTANEOUS | 0 refills | Status: AC

## 2021-02-07 MED ORDER — METHOCARBAMOL 500 MG PO TABS: 500.0000 mg | ORAL_TABLET | Freq: Two times a day (BID) | ORAL | 0 refills | Status: AC

## 2021-02-07 MED ORDER — NAPROXEN 500 MG PO TABS: 500.0000 mg | ORAL_TABLET | Freq: Two times a day (BID) | ORAL | 0 refills | Status: AC

## 2021-02-07 NOTE — Discharge Instructions (Addendum)
Take the medication as needed to help with your symptoms. Apply heat or ice in the area as well. It may take several weeks for your symptoms to improve. Follow-up with your primary care provider. Return to the ER if you start to experience worsening pain, additional injuries, numbness in legs, losing control of your bowels or bladder.
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
## 2021-02-07 NOTE — ED Triage Notes (Signed)
Pt arrived via POV, c/o mechanical fall Saturday, onto buttocks. Pain worsening today,

## 2023-05-01 IMAGING — CR DG LUMBAR SPINE COMPLETE 4+V
5 series · 5 of 5 positions shown · non-contrast
Comparison: X-ray lumbar spine dated February 11, 2019

CLINICAL DATA: Fall

EXAM:
SACRUM AND COCCYX - 2+ VIEW; LUMBAR SPINE - COMPLETE 4+ VIEW

[t lumbar spine ap]
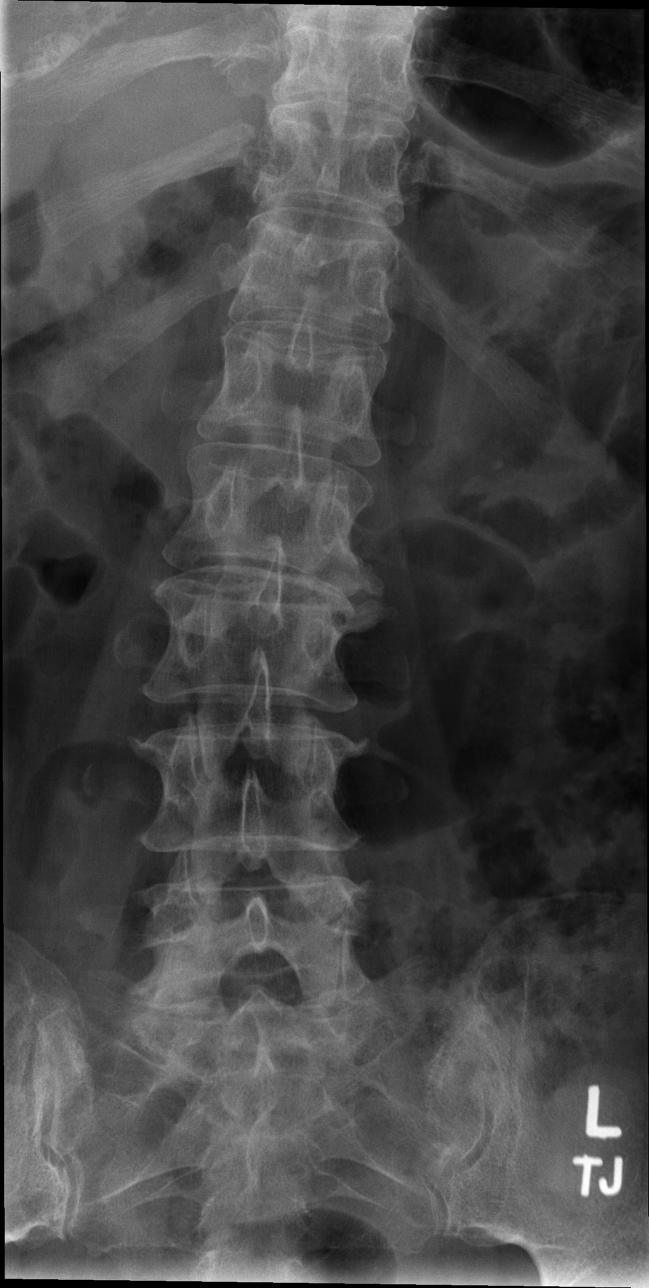

[t lumbar spine obl (1 of 2)]
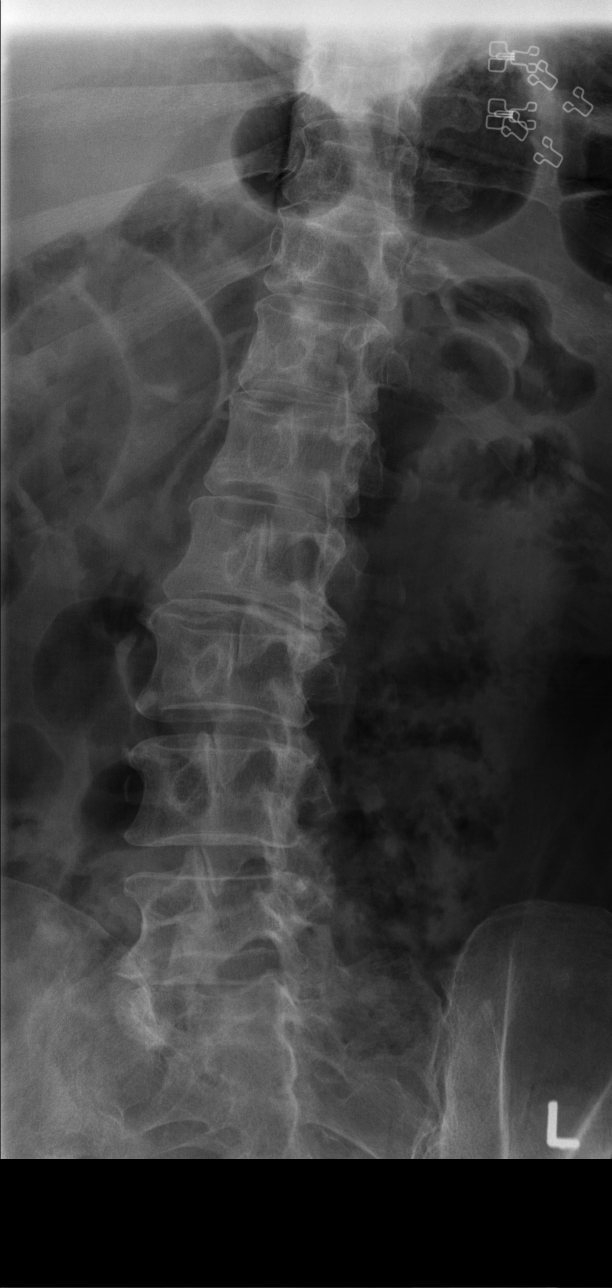

[t lumbar spine obl (2 of 2)]
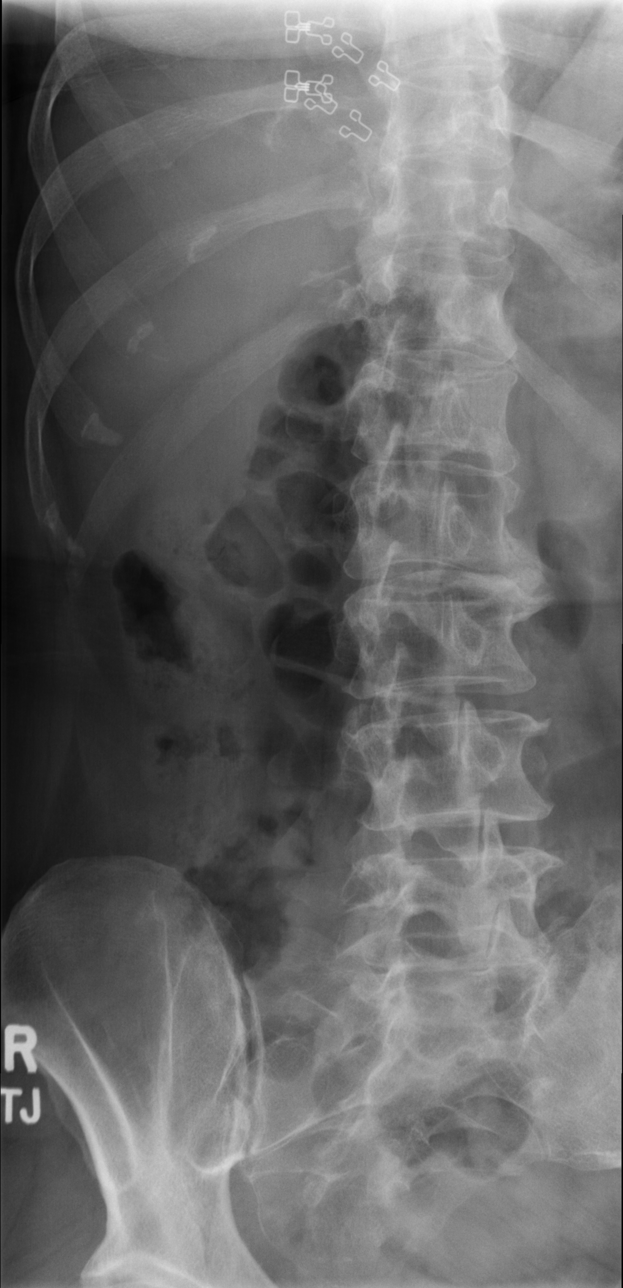

[t lumbar spine lat]
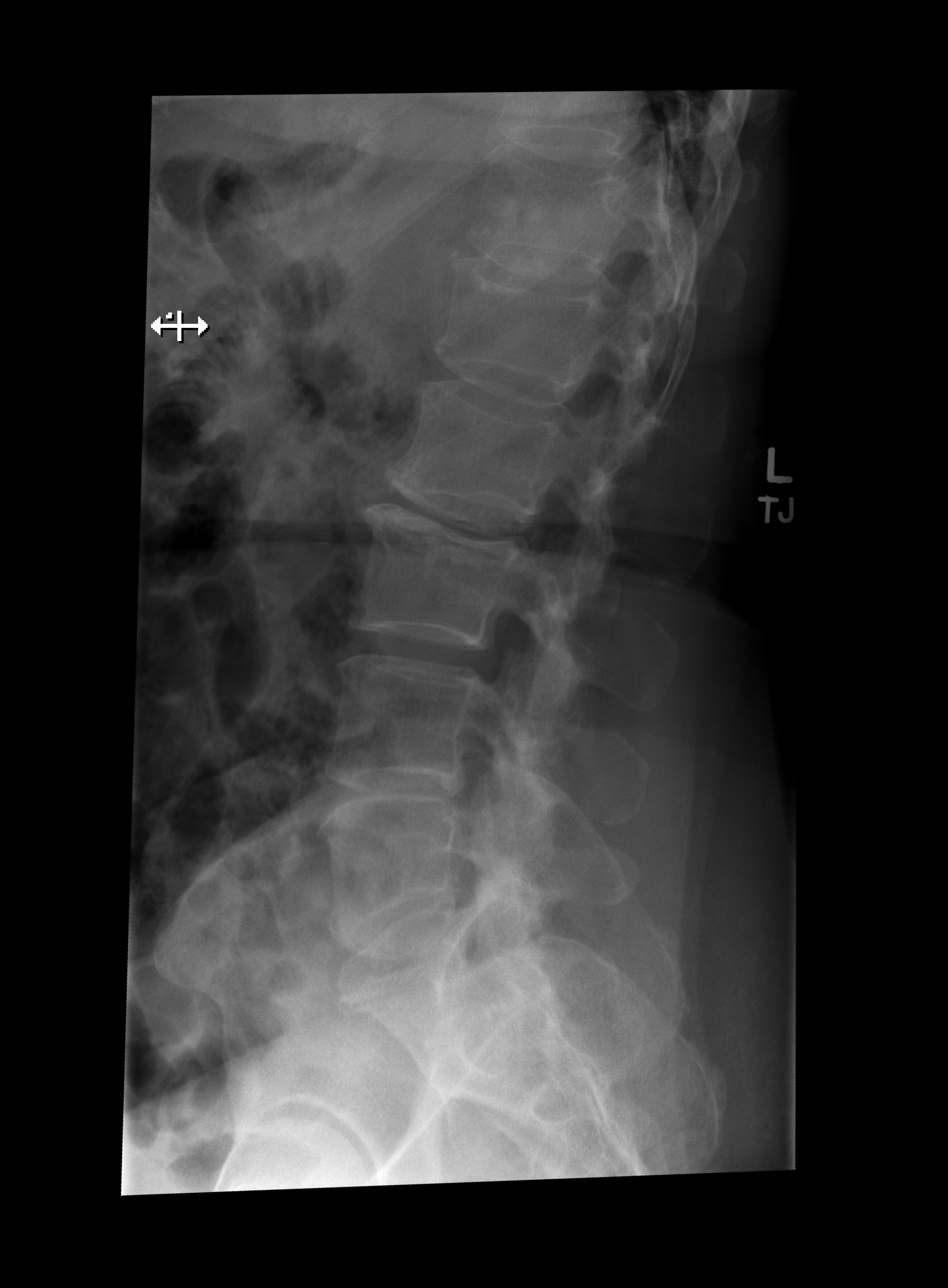

[t lumbar l-5 s-1 spot]
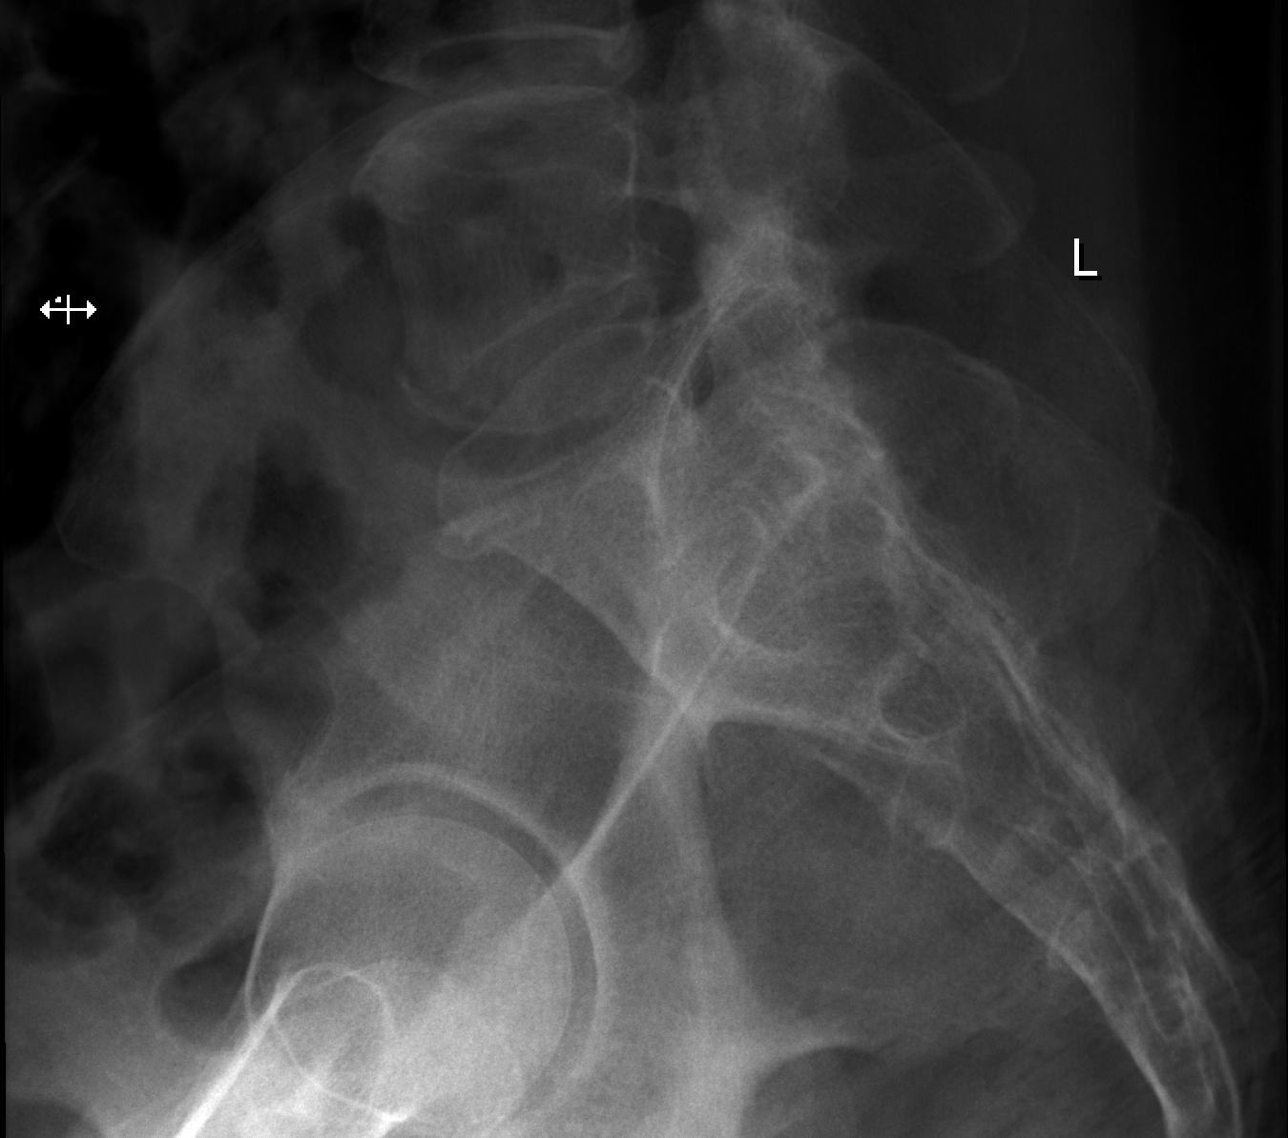

[5 of 5 positions shown; findings below may reference images not displayed]

FINDINGS: Five non-rib-bearing lumbar type vertebral bodies. No evidence of
lumbar spine fracture. Mild degenerative changes at L2-L3 with disc
space narrowing and endplate changes. No significant facet
arthropathy.

Dedicated images of the sacrum and coccyx demonstrate no acute
osseous abnormality.
IMPRESSION: No acute osseous abnormality.

## 2023-10-30 ENCOUNTER — Encounter (HOSPITAL_COMMUNITY): Payer: Self-pay | Admitting: Emergency Medicine

## 2023-10-30 ENCOUNTER — Emergency Department (HOSPITAL_COMMUNITY)
Admission: EM | Admit: 2023-10-30 | Discharge: 2023-10-30 | Disposition: A | Discharge status: Home / Self Care | Payer: Self-pay | Attending: Emergency Medicine | Admitting: Emergency Medicine

## 2023-10-30 ENCOUNTER — Other Ambulatory Visit: Payer: Self-pay

## 2023-10-30 ENCOUNTER — Emergency Department (HOSPITAL_COMMUNITY): Payer: Self-pay

## 2023-10-30 DIAGNOSIS — Z8543 Personal history of malignant neoplasm of ovary: Secondary | ICD-10-CM | POA: Insufficient documentation

## 2023-10-30 DIAGNOSIS — W010XXA Fall on same level from slipping, tripping and stumbling without subsequent striking against object, initial encounter: Secondary | ICD-10-CM | POA: Insufficient documentation

## 2023-10-30 DIAGNOSIS — E049 Nontoxic goiter, unspecified: Secondary | ICD-10-CM | POA: Insufficient documentation

## 2023-10-30 DIAGNOSIS — W19XXXA Unspecified fall, initial encounter: Secondary | ICD-10-CM | POA: Insufficient documentation

## 2023-10-30 DIAGNOSIS — E041 Nontoxic single thyroid nodule: Secondary | ICD-10-CM

## 2023-10-30 DIAGNOSIS — S0003XA Contusion of scalp, initial encounter: Secondary | ICD-10-CM | POA: Insufficient documentation

## 2023-10-30 MED ORDER — ACETAMINOPHEN 500 MG PO TABS
1000.0000 mg | ORAL_TABLET | Freq: Once | ORAL | Status: AC
  Administered 2023-10-30: 1000 mg via ORAL
  Filled 2023-10-30: qty 2

## 2023-10-30 NOTE — ED Triage Notes (Addendum)
 Patient report she tripped and fell on her back. Patient report she hit in the back of her head in the floor. Patient denies LOC. Patient denies Dizziness. Patient sustain abrasion in the back of her head. Patient denies N/V. Patient denies taking blood thinners.

## 2023-10-30 NOTE — ED Notes (Signed)
 Registration @ bedside

## 2023-10-30 NOTE — ED Provider Notes (Signed)
 [facility] DEPARTMENT AT [facility] Provider Note   CSN: [id number] Arrival date & time: 10/30/23  9368     Patient presents with: Brenda Gallagher is a 58 y.o. female.  Patient with past history significant for ovarian cancer presents the emergency department today with concerns of a fall.  She reports that she sustained a mechanical fall with injury to the back of her head earlier today while at work.  She states that she tripped on the floor and landed directly on her posterior head.  Endorses mild headache at this time.  Denies loss of consciousness following the fall.  No reported nausea, vomiting, or dizziness.  Not on blood thinners or aspirin.  She does currently rate her head pain an 8 out of 10 around the area of the scalp.  Does report that she had sustained some bleeding following the fall but denies any active bleeding at this time.   Fall       Prior to Admission medications   Medication Sig Start Date End Date Taking? Authorizing Provider  acetaminophen  (TYLENOL ) 500 MG tablet Take 1,000 mg by mouth daily as needed for moderate pain.    [provider]  cyclobenzaprine  (FLEXERIL ) 10 MG tablet Take 1 tablet (10 mg total) by mouth 2 (two) times daily as needed for up to 20 doses for muscle spasms. 02/11/19   Curatolo, Adam, DO  lidocaine  (LIDODERM ) 5 % Place 1 patch onto the skin daily. Remove & Discard patch within 12 hours or as directed by MD 02/07/21   Leotis Sole, PA-C  methocarbamol  (ROBAXIN ) 500 MG tablet Take 1 tablet (500 mg total) by mouth 2 (two) times daily. 02/07/21   Khatri, Hina, PA-C  naproxen  (NAPROSYN ) 500 MG tablet Take 1 tablet (500 mg total) by mouth 2 (two) times daily. 02/07/21   Khatri, Hina, PA-C  predniSONE  (DELTASONE ) 50 MG tablet Take 1 tablet (50 mg total) by mouth daily with breakfast. 02/18/19   Babara, Amy V, PA-C    Allergies: Sulfa antibiotics    Review of Systems  HENT:         Head injury  Skin:  Positive  for wound.  All other systems reviewed and are negative.   Updated Vital Signs BP (!) 161/96   Pulse 86   Temp 98 F (36.7 C)   Resp 16   Ht 5' (1.524 m)   Wt 61 kg   LMP 04/02/2014 (Approximate)   SpO2 100%   BMI 26.26 kg/m   Physical Exam Vitals and nursing note reviewed.  Constitutional:      General: She is not in acute distress.    Appearance: She is well-developed.  HENT:     Head: Normocephalic and atraumatic.     Comments: No mastoid tenderness present.  Eyes:     Conjunctiva/sclera: Conjunctivae normal.    Cardiovascular:     Rate and Rhythm: Normal rate and regular rhythm.     Heart sounds: No murmur heard. Pulmonary:     Effort: Pulmonary effort is normal. No respiratory distress.     Breath sounds: Normal breath sounds.  Abdominal:     Palpations: Abdomen is soft.     Tenderness: There is no abdominal tenderness.   Musculoskeletal:        General: No swelling.     Cervical back: Neck supple.   Skin:    General: Skin is warm and dry.     Capillary Refill: Capillary refill  takes less than 2 seconds.         Comments: Small area of abrasion to the left posterior scalp with no obvious lesions or lacerations.  Minimal bleeding at this time.   Neurological:     Mental Status: She is alert.   Psychiatric:        Mood and Affect: Mood normal.     (all labs ordered are listed, but only abnormal results are displayed) Labs Reviewed - No data to display  EKG: None  Radiology: CT Head Wo Contrast Result Date: 10/30/2023 CLINICAL DATA:  Clemens to the floor and hit the back of her head. EXAM: CT HEAD WITHOUT CONTRAST CT CERVICAL SPINE WITHOUT CONTRAST TECHNIQUE: Multidetector CT imaging of the head and cervical spine was performed following the standard protocol without intravenous contrast. Multiplanar CT image reconstructions of the cervical spine were also generated. RADIATION DOSE REDUCTION: This exam was performed according to the departmental  dose-optimization program which includes automated exposure control, adjustment of the mA and/or kV according to patient size and/or use of iterative reconstruction technique. COMPARISON:  Head CT 08/23/2011.  No prior cervical spine imaging. FINDINGS: CT HEAD FINDINGS Brain: No evidence of acute infarction, hemorrhage, hydrocephalus, extra-axial collection or mass lesion/mass effect. There is mild age related cerebral cortical volume loss, without visible small-vessel disease. Vascular: No hyperdense vessel or unexpected calcification. Skull: Negative for fractures or focal lesions. Sinuses/Orbits: Clear paranasal sinuses and right mastoids. Fluid opacification left mastoid air cells with preserved middle ear aeration. This is new from 2013. The nasal septum deviates to the right. Visualized orbits are unremarkable. Other: None. CT CERVICAL SPINE FINDINGS Alignment: There is a straightened slightly reversed cervical lordosis, with minimal grade 1 anterolisthesis at C3-4, C4-5 and C5-6 likely degenerative in etiology. No other listhesis is seen. No traumatic listhesis is suspected. There is no widening of the anterior atlantodental joint. Skull base and vertebrae: No fracture or primary pathologic process is seen. Soft tissues and spinal canal: No prevertebral fluid or swelling. No visible canal hematoma. There is a heterogeneous thyroid gland with multiple subcentimeter nodules, larger heterogeneous nodule in left inferior pole measuring 2 cm. Nonemergent follow-up thyroid ultrasound recommended. Disc levels: There is mild disc space loss at C5-6, C6-7 and C7-8, with bidirectional osteophytes. At C6-7, posterior disc osteophyte complex eccentric to the left causes at least a mild compression of the cord with moderate spinal canal stenosis, left greater than right. Other levels do not show significant mass effect on the cord surface. The discs are normal in heights above C4. There is a small central disc protrusion  at C2-3 and at C3-4, but without mass effect. There is facet joint and uncinate spurring at most levels, with moderate to severe left foraminal stenosis at C6-7, mild foraminal stenosis C4-5 and C5-6. Upper chest: Negative. Other: None. IMPRESSION: 1. No acute intracranial CT findings or depressed skull fractures. 2. Left mastoid effusion new from 2013. 3. Straightened slightly reversed cervical lordosis with minimal grade 1 anterolisthesis at C3-4, C4-5 and C5-6 likely degenerative in etiology. No traumatic listhesis is suspected. 4. Degenerative changes of the cervical spine, with at least a mild compression of the cord at C6-7 due to posterior disc osteophyte complex eccentric to the left. 5. 2 cm heterogeneous nodule in the left inferior pole of the thyroid gland. Nonemergent follow-up thyroid ultrasound recommended. Electronically Signed   By: Francis Quam M.D.   On: 10/30/2023 07:26   CT Cervical Spine Wo Contrast Result Date: 10/30/2023 CLINICAL  DATA:  Clemens to the floor and hit the back of her head. EXAM: CT HEAD WITHOUT CONTRAST CT CERVICAL SPINE WITHOUT CONTRAST TECHNIQUE: Multidetector CT imaging of the head and cervical spine was performed following the standard protocol without intravenous contrast. Multiplanar CT image reconstructions of the cervical spine were also generated. RADIATION DOSE REDUCTION: This exam was performed according to the departmental dose-optimization program which includes automated exposure control, adjustment of the mA and/or kV according to patient size and/or use of iterative reconstruction technique. COMPARISON:  Head CT 08/23/2011.  No prior cervical spine imaging. FINDINGS: CT HEAD FINDINGS Brain: No evidence of acute infarction, hemorrhage, hydrocephalus, extra-axial collection or mass lesion/mass effect. There is mild age related cerebral cortical volume loss, without visible small-vessel disease. Vascular: No hyperdense vessel or unexpected calcification. Skull:  Negative for fractures or focal lesions. Sinuses/Orbits: Clear paranasal sinuses and right mastoids. Fluid opacification left mastoid air cells with preserved middle ear aeration. This is new from 2013. The nasal septum deviates to the right. Visualized orbits are unremarkable. Other: None. CT CERVICAL SPINE FINDINGS Alignment: There is a straightened slightly reversed cervical lordosis, with minimal grade 1 anterolisthesis at C3-4, C4-5 and C5-6 likely degenerative in etiology. No other listhesis is seen. No traumatic listhesis is suspected. There is no widening of the anterior atlantodental joint. Skull base and vertebrae: No fracture or primary pathologic process is seen. Soft tissues and spinal canal: No prevertebral fluid or swelling. No visible canal hematoma. There is a heterogeneous thyroid gland with multiple subcentimeter nodules, larger heterogeneous nodule in left inferior pole measuring 2 cm. Nonemergent follow-up thyroid ultrasound recommended. Disc levels: There is mild disc space loss at C5-6, C6-7 and C7-8, with bidirectional osteophytes. At C6-7, posterior disc osteophyte complex eccentric to the left causes at least a mild compression of the cord with moderate spinal canal stenosis, left greater than right. Other levels do not show significant mass effect on the cord surface. The discs are normal in heights above C4. There is a small central disc protrusion at C2-3 and at C3-4, but without mass effect. There is facet joint and uncinate spurring at most levels, with moderate to severe left foraminal stenosis at C6-7, mild foraminal stenosis C4-5 and C5-6. Upper chest: Negative. Other: None. IMPRESSION: 1. No acute intracranial CT findings or depressed skull fractures. 2. Left mastoid effusion new from 2013. 3. Straightened slightly reversed cervical lordosis with minimal grade 1 anterolisthesis at C3-4, C4-5 and C5-6 likely degenerative in etiology. No traumatic listhesis is suspected. 4.  Degenerative changes of the cervical spine, with at least a mild compression of the cord at C6-7 due to posterior disc osteophyte complex eccentric to the left. 5. 2 cm heterogeneous nodule in the left inferior pole of the thyroid gland. Nonemergent follow-up thyroid ultrasound recommended. Electronically Signed   By: Francis Quam M.D.   On: 10/30/2023 07:26    Procedures   Medications Ordered in the ED  acetaminophen  (TYLENOL ) tablet 1,000 mg (1,000 mg Oral Given 10/30/23 9281)                                  Medical Decision Making Amount and/or Complexity of Data Reviewed Radiology: ordered.   This patient presents to the ED for concern of head injury, this involves an extensive number of treatment options, and is a complaint that carries with it a high risk of complications and morbidity.  The differential diagnosis includes  concussion, scalp lesion, SDH, hematoma   Co morbidities that complicate the patient evaluation  History of ovarian cancer   Imaging Studies ordered:  I ordered imaging studies including CT head, CT cervical spine I independently visualized and interpreted imaging which showed 1. No acute intracranial CT findings or depressed skull fractures. 2. Left mastoid effusion new from 2013. 3. Straightened slightly reversed cervical lordosis with minimal grade 1 anterolisthesis at C3-4, C4-5 and C5-6 likely degenerative in etiology. No traumatic listhesis is suspected. 4. Degenerative changes of the cervical spine, with at least a mild compression of the cord at C6-7 due to posterior disc osteophyte complex eccentric to the left. 5. 2 cm heterogeneous nodule in the left inferior pole of the thyroid gland. Nonemergent follow-up thyroid ultrasound recommended. I agree with the radiologist interpretation   Consultations Obtained:  I requested consultation with none,  and discussed lab and imaging findings as well as pertinent plan - they recommend: N/A   Problem  List / ED Course / Critical interventions / Medication management  Patient presents to the emergency department following a mechanical fall.  Reports that she was at work when she slipped and fell backwards landing on the back of her head.  Nursing had pain rated 8 out of 10.  States her blood pressure is notably elevated compared to her baseline.  Not on blood thinners.  Only pain is primarily with a headache in the pain to scalp where she sustained impact but denies any other symptoms such as dizziness, lightheadedness, nausea, vomiting, or feelings of weakness. Exam reveals a small abrasion to the left posterior scalp.  Based on mechanism and patient's rate of pain as well as elevated blood pressure, will obtain CT imaging of the head and cervical spine. I ordered medication including Tylenol   for head pain  Reevaluation of the patient after these medicines showed that the patient improved I have reviewed the patients home medicines and have made adjustments as needed Patient's imaging is thankfully reassuring without any significant findings that appear to be due to injury.  No evidence of any intracranial bleeding or other injuries.  Incidental findings of a left mastoid effusion as well as a left thyroid nodule.  No mastoid tenderness present.  Informed patient of thyroid nodule and will plan on outpatient follow-up.  Given no other acute or emergent findings or concerns at this time, will discharge home with a plan for outpatient follow-up with PCP.  Advise continued use of Tylenol  and ibuprofen  for pain.  Strict return precautions discussed such as development of new or worsening symptoms.  Otherwise stable at this time for outpatient follow-up and discharged home   Social Determinants of Health:  History of cancer   Test / Admission - Considered:  Based on patient's history and mechanism of injury, patient found to not benefit from inpatient admission.  Final diagnoses:  Fall, initial  encounter  Hematoma of scalp, initial encounter  Thyroid nodule greater than or equal to 1.5 cm in diameter incidentally noted on imaging study    ED Discharge Orders     None          Cecily Legrand LABOR, PA-C 10/30/23 0752    Mannie Pac T, DO 11/01/23 [phone]

## 2023-10-30 NOTE — Discharge Instructions (Addendum)
 You are seen in the emergency department today following a fall.  Your imaging of your head and neck are thankfully reassuring without any significant findings found from the impact of your head injury.  Continue to take Tylenol  ibuprofen  at home for control of your pain.  If you have any significant change in your symptoms in the next 24 to 48 hours such as development of a severe headache, nausea, vomiting, dizziness, lightheadedness, return to the emergency department for likely repeat imaging.  Your imaging did find a thyroid nodule on your left side that should receive ultrasound imaging for further evaluation.
